# Patient Record
Sex: Female | Born: 1955
Health system: Southern US, Community
[De-identification: ages and names within clinical notes are randomized; demographics above are authoritative.]

## PROBLEM LIST (undated history)

## (undated) DIAGNOSIS — Q613 Polycystic kidney, unspecified: Secondary | ICD-10-CM

## (undated) DIAGNOSIS — M4800 Spinal stenosis, site unspecified: Secondary | ICD-10-CM

## (undated) DIAGNOSIS — M4722 Other spondylosis with radiculopathy, cervical region: Secondary | ICD-10-CM

## (undated) DIAGNOSIS — N200 Calculus of kidney: Secondary | ICD-10-CM

## (undated) DIAGNOSIS — M19049 Primary osteoarthritis, unspecified hand: Secondary | ICD-10-CM

## (undated) DIAGNOSIS — Z86010 Personal history of colonic polyps: Secondary | ICD-10-CM

## (undated) DIAGNOSIS — Z860101 Personal history of adenomatous and serrated colon polyps: Secondary | ICD-10-CM

## (undated) DIAGNOSIS — Q612 Polycystic kidney, adult type: Secondary | ICD-10-CM

## (undated) DIAGNOSIS — F411 Generalized anxiety disorder: Secondary | ICD-10-CM

## (undated) DIAGNOSIS — K7689 Other specified diseases of liver: Secondary | ICD-10-CM

## (undated) DIAGNOSIS — I10 Essential (primary) hypertension: Secondary | ICD-10-CM

## (undated) DIAGNOSIS — L290 Pruritus ani: Secondary | ICD-10-CM

## (undated) DIAGNOSIS — I493 Ventricular premature depolarization: Secondary | ICD-10-CM

## (undated) DIAGNOSIS — J309 Allergic rhinitis, unspecified: Secondary | ICD-10-CM

## (undated) DIAGNOSIS — J4541 Moderate persistent asthma with (acute) exacerbation: Secondary | ICD-10-CM

## (undated) HISTORY — DX: Calculus of kidney: N20.0

## (undated) HISTORY — DX: Moderate persistent asthma with (acute) exacerbation: J45.41

## (undated) HISTORY — DX: Other spondylosis with radiculopathy, cervical region: M47.22

## (undated) HISTORY — DX: Pruritus ani: L29.0

## (undated) HISTORY — DX: Spinal stenosis, site unspecified: M48.00

## (undated) HISTORY — DX: Primary osteoarthritis, unspecified hand: M19.049

## (undated) HISTORY — DX: Personal history of adenomatous and serrated colon polyps: Z86.0101

## (undated) HISTORY — DX: Other specified diseases of liver: K76.89

## (undated) HISTORY — DX: Generalized anxiety disorder: F41.1

## (undated) HISTORY — DX: Allergic rhinitis, unspecified: J30.9

## (undated) HISTORY — DX: Personal history of colonic polyps: Z86.010

## (undated) HISTORY — PX: AUGMENTATION MAMMAPLASTY: SUR837

## (undated) HISTORY — DX: Ventricular premature depolarization: I49.3

## (undated) HISTORY — DX: Polycystic kidney, adult type: Q61.2

## (undated) HISTORY — DX: Essential (primary) hypertension: I10

## (undated) HISTORY — DX: Polycystic kidney, unspecified: Q61.3

---

## 1990-03-02 HISTORY — PX: DILATION AND CURETTAGE OF UTERUS: SHX78

## 1991-03-03 HISTORY — PX: TUBAL LIGATION: SHX77

## 1997-03-02 HISTORY — PX: BREAST ENHANCEMENT SURGERY: SHX7

## 1997-07-30 ENCOUNTER — Ambulatory Visit (HOSPITAL_COMMUNITY): Admission: RE | Admit: 1997-07-30 | Discharge: 1997-07-30 | Payer: Self-pay | Admitting: Obstetrics and Gynecology

## 1997-09-11 ENCOUNTER — Other Ambulatory Visit: Admission: RE | Admit: 1997-09-11 | Discharge: 1997-09-11 | Payer: Self-pay | Admitting: Obstetrics and Gynecology

## 1998-04-08 ENCOUNTER — Other Ambulatory Visit: Admission: RE | Admit: 1998-04-08 | Discharge: 1998-04-08 | Payer: Self-pay | Admitting: Obstetrics and Gynecology

## 1998-07-29 ENCOUNTER — Encounter: Payer: Self-pay | Admitting: Obstetrics and Gynecology

## 1998-07-29 ENCOUNTER — Ambulatory Visit (HOSPITAL_COMMUNITY): Admission: RE | Admit: 1998-07-29 | Discharge: 1998-07-29 | Payer: Self-pay | Admitting: Obstetrics and Gynecology

## 1999-04-08 ENCOUNTER — Other Ambulatory Visit: Admission: RE | Admit: 1999-04-08 | Discharge: 1999-04-08 | Payer: Self-pay | Admitting: Obstetrics and Gynecology

## 1999-08-12 ENCOUNTER — Ambulatory Visit (HOSPITAL_COMMUNITY): Admission: RE | Admit: 1999-08-12 | Discharge: 1999-08-12 | Payer: Self-pay | Admitting: Internal Medicine

## 1999-08-12 ENCOUNTER — Encounter: Payer: Self-pay | Admitting: Internal Medicine

## 2000-04-07 ENCOUNTER — Other Ambulatory Visit: Admission: RE | Admit: 2000-04-07 | Discharge: 2000-04-07 | Payer: Self-pay | Admitting: Obstetrics and Gynecology

## 2000-08-17 ENCOUNTER — Ambulatory Visit (HOSPITAL_COMMUNITY): Admission: RE | Admit: 2000-08-17 | Discharge: 2000-08-17 | Payer: Self-pay | Admitting: Obstetrics and Gynecology

## 2000-08-17 ENCOUNTER — Encounter: Payer: Self-pay | Admitting: Obstetrics and Gynecology

## 2001-02-02 ENCOUNTER — Encounter: Admission: RE | Admit: 2001-02-02 | Discharge: 2001-02-02 | Payer: Self-pay | Admitting: Internal Medicine

## 2001-02-02 ENCOUNTER — Encounter: Payer: Self-pay | Admitting: Internal Medicine

## 2001-04-25 ENCOUNTER — Other Ambulatory Visit: Admission: RE | Admit: 2001-04-25 | Discharge: 2001-04-25 | Payer: Self-pay | Admitting: Obstetrics and Gynecology

## 2001-08-30 ENCOUNTER — Ambulatory Visit (HOSPITAL_COMMUNITY): Admission: RE | Admit: 2001-08-30 | Discharge: 2001-08-30 | Payer: Self-pay | Admitting: Obstetrics and Gynecology

## 2001-08-30 ENCOUNTER — Encounter: Payer: Self-pay | Admitting: Obstetrics and Gynecology

## 2002-05-01 ENCOUNTER — Other Ambulatory Visit: Admission: RE | Admit: 2002-05-01 | Discharge: 2002-05-01 | Payer: Self-pay | Admitting: Obstetrics and Gynecology

## 2002-09-12 ENCOUNTER — Encounter: Payer: Self-pay | Admitting: Obstetrics and Gynecology

## 2002-09-12 ENCOUNTER — Ambulatory Visit (HOSPITAL_COMMUNITY): Admission: RE | Admit: 2002-09-12 | Discharge: 2002-09-12 | Payer: Self-pay | Admitting: Obstetrics and Gynecology

## 2003-05-08 ENCOUNTER — Other Ambulatory Visit: Admission: RE | Admit: 2003-05-08 | Discharge: 2003-05-08 | Payer: Self-pay | Admitting: Obstetrics and Gynecology

## 2003-09-18 ENCOUNTER — Ambulatory Visit (HOSPITAL_COMMUNITY): Admission: RE | Admit: 2003-09-18 | Discharge: 2003-09-18 | Payer: Self-pay | Admitting: Obstetrics and Gynecology

## 2004-02-06 ENCOUNTER — Ambulatory Visit: Admission: RE | Admit: 2004-02-06 | Discharge: 2004-02-06 | Payer: Self-pay | Admitting: Internal Medicine

## 2004-02-06 ENCOUNTER — Encounter (INDEPENDENT_AMBULATORY_CARE_PROVIDER_SITE_OTHER): Payer: Self-pay | Admitting: *Deleted

## 2004-03-12 ENCOUNTER — Ambulatory Visit: Payer: Self-pay | Admitting: Cardiology

## 2004-03-19 ENCOUNTER — Ambulatory Visit: Payer: Self-pay | Admitting: Cardiology

## 2004-04-02 ENCOUNTER — Ambulatory Visit: Payer: Self-pay | Admitting: Internal Medicine

## 2004-04-28 ENCOUNTER — Ambulatory Visit: Payer: Self-pay | Admitting: Cardiology

## 2004-05-12 ENCOUNTER — Other Ambulatory Visit: Admission: RE | Admit: 2004-05-12 | Discharge: 2004-05-12 | Payer: Self-pay | Admitting: Obstetrics and Gynecology

## 2004-06-09 ENCOUNTER — Ambulatory Visit: Payer: Self-pay | Admitting: Internal Medicine

## 2004-09-22 ENCOUNTER — Ambulatory Visit (HOSPITAL_COMMUNITY): Admission: RE | Admit: 2004-09-22 | Discharge: 2004-09-22 | Payer: Self-pay | Admitting: Obstetrics and Gynecology

## 2004-09-22 ENCOUNTER — Ambulatory Visit (HOSPITAL_COMMUNITY): Admission: RE | Admit: 2004-09-22 | Discharge: 2004-09-22 | Payer: Self-pay | Admitting: Internal Medicine

## 2004-09-22 ENCOUNTER — Ambulatory Visit: Payer: Self-pay | Admitting: Internal Medicine

## 2004-10-06 ENCOUNTER — Ambulatory Visit: Payer: Self-pay | Admitting: Internal Medicine

## 2004-10-17 ENCOUNTER — Ambulatory Visit: Payer: Self-pay | Admitting: Internal Medicine

## 2004-12-08 ENCOUNTER — Ambulatory Visit: Payer: Self-pay | Admitting: Internal Medicine

## 2005-01-12 ENCOUNTER — Ambulatory Visit: Payer: Self-pay | Admitting: Internal Medicine

## 2005-02-27 ENCOUNTER — Ambulatory Visit: Payer: Self-pay | Admitting: Internal Medicine

## 2005-04-06 ENCOUNTER — Ambulatory Visit: Payer: Self-pay | Admitting: Internal Medicine

## 2005-07-29 ENCOUNTER — Other Ambulatory Visit: Admission: RE | Admit: 2005-07-29 | Discharge: 2005-07-29 | Payer: Self-pay | Admitting: Obstetrics and Gynecology

## 2005-09-24 ENCOUNTER — Ambulatory Visit (HOSPITAL_COMMUNITY): Admission: RE | Admit: 2005-09-24 | Discharge: 2005-09-24 | Payer: Self-pay | Admitting: Obstetrics and Gynecology

## 2005-10-06 ENCOUNTER — Ambulatory Visit: Payer: Self-pay | Admitting: Internal Medicine

## 2005-12-14 ENCOUNTER — Ambulatory Visit: Payer: Self-pay | Admitting: Internal Medicine

## 2005-12-28 ENCOUNTER — Ambulatory Visit: Payer: Self-pay | Admitting: Internal Medicine

## 2005-12-31 ENCOUNTER — Ambulatory Visit (HOSPITAL_COMMUNITY): Admission: RE | Admit: 2005-12-31 | Discharge: 2005-12-31 | Payer: Self-pay | Admitting: Internal Medicine

## 2006-01-01 ENCOUNTER — Ambulatory Visit: Payer: Self-pay | Admitting: Internal Medicine

## 2006-01-04 ENCOUNTER — Observation Stay (HOSPITAL_COMMUNITY): Admission: AD | Admit: 2006-01-04 | Discharge: 2006-01-05 | Payer: Self-pay | Admitting: Internal Medicine

## 2006-01-05 ENCOUNTER — Ambulatory Visit: Payer: Self-pay | Admitting: Internal Medicine

## 2006-01-11 ENCOUNTER — Ambulatory Visit (HOSPITAL_COMMUNITY): Admission: RE | Admit: 2006-01-11 | Discharge: 2006-01-11 | Payer: Self-pay | Admitting: Internal Medicine

## 2006-01-13 ENCOUNTER — Ambulatory Visit: Payer: Self-pay | Admitting: Internal Medicine

## 2006-02-01 ENCOUNTER — Ambulatory Visit (HOSPITAL_COMMUNITY): Admission: RE | Admit: 2006-02-01 | Discharge: 2006-02-01 | Payer: Self-pay | Admitting: Internal Medicine

## 2006-03-11 ENCOUNTER — Ambulatory Visit: Payer: Self-pay | Admitting: Internal Medicine

## 2006-03-24 ENCOUNTER — Ambulatory Visit: Payer: Self-pay | Admitting: Internal Medicine

## 2006-04-05 ENCOUNTER — Ambulatory Visit: Payer: Self-pay | Admitting: Internal Medicine

## 2006-04-05 LAB — CONVERTED CEMR LAB
Anti Nuclear Antibody(ANA): NEGATIVE
Rhuematoid fact SerPl-aCnc: <20 intl units/mL — ABNORMAL LOW (ref 0.0–20.0)
Rhuematoid fact SerPl-aCnc: <20 intl units/mL — ABNORMAL LOW (ref 0.0–20.0)
Sed Rate: 10 mm/hr (ref 0–25)
Sed Rate: 10 mm/hr (ref 0–25)
Vitamin B-12: 465 pg/mL (ref 211–911)

## 2006-04-13 ENCOUNTER — Ambulatory Visit: Payer: Self-pay | Admitting: Internal Medicine

## 2006-06-29 ENCOUNTER — Ambulatory Visit: Payer: Self-pay | Admitting: Internal Medicine

## 2006-06-29 LAB — CONVERTED CEMR LAB
BUN: 9 mg/dL (ref 6–23)
CO2: 30 meq/L (ref 19–32)
Calcium: 9.6 mg/dL (ref 8.4–10.5)
Chloride: 103 meq/L (ref 96–112)
Creatinine, Ser: 0.8 mg/dL (ref 0.4–1.2)
GFR calc Af Amer: 98 mL/min
GFR calc non Af Amer: 81 mL/min
Glucose, Bld: 97 mg/dL (ref 70–99)
Potassium: 4.4 meq/L (ref 3.5–5.1)
Sodium: 140 meq/L (ref 135–145)
TSH: 0.62 microintl units/mL (ref 0.35–5.50)
Vit D, 1,25-Dihydroxy: 64 — ABNORMAL HIGH (ref 20–57)
Vitamin B-12: 484 pg/mL (ref 211–911)

## 2006-08-26 ENCOUNTER — Other Ambulatory Visit: Admission: RE | Admit: 2006-08-26 | Discharge: 2006-08-26 | Payer: Self-pay | Admitting: Obstetrics and Gynecology

## 2006-09-22 ENCOUNTER — Encounter: Payer: Self-pay | Admitting: Internal Medicine

## 2006-09-22 DIAGNOSIS — G622 Polyneuropathy due to other toxic agents: Secondary | ICD-10-CM

## 2006-09-22 DIAGNOSIS — G6189 Other inflammatory polyneuropathies: Secondary | ICD-10-CM | POA: Insufficient documentation

## 2006-09-27 ENCOUNTER — Ambulatory Visit (HOSPITAL_COMMUNITY): Admission: RE | Admit: 2006-09-27 | Discharge: 2006-09-27 | Payer: Self-pay | Admitting: Obstetrics and Gynecology

## 2006-12-20 ENCOUNTER — Ambulatory Visit: Payer: Self-pay | Admitting: Internal Medicine

## 2006-12-27 DIAGNOSIS — J309 Allergic rhinitis, unspecified: Secondary | ICD-10-CM | POA: Insufficient documentation

## 2006-12-27 DIAGNOSIS — J45909 Unspecified asthma, uncomplicated: Secondary | ICD-10-CM | POA: Insufficient documentation

## 2006-12-27 DIAGNOSIS — I10 Essential (primary) hypertension: Secondary | ICD-10-CM | POA: Insufficient documentation

## 2007-06-16 ENCOUNTER — Ambulatory Visit: Payer: Self-pay | Admitting: Internal Medicine

## 2007-06-16 DIAGNOSIS — R519 Headache, unspecified: Secondary | ICD-10-CM | POA: Insufficient documentation

## 2007-06-16 DIAGNOSIS — R51 Headache: Secondary | ICD-10-CM | POA: Insufficient documentation

## 2007-06-16 DIAGNOSIS — J019 Acute sinusitis, unspecified: Secondary | ICD-10-CM | POA: Insufficient documentation

## 2007-06-17 LAB — CONVERTED CEMR LAB
BUN: 17 mg/dL (ref 6–23)
Basophils Absolute: 0 10*3/uL (ref 0.0–0.1)
Basophils Relative: 0.3 % (ref 0.0–1.0)
Bilirubin Urine: NEGATIVE
CO2: 29 meq/L (ref 19–32)
Calcium: 9.3 mg/dL (ref 8.4–10.5)
Chloride: 102 meq/L (ref 96–112)
Cholesterol: 169 mg/dL (ref 0–200)
Creatinine, Ser: 0.8 mg/dL (ref 0.4–1.2)
Eosinophils Absolute: 0.1 10*3/uL (ref 0.0–0.7)
Eosinophils Relative: 1.5 % (ref 0.0–5.0)
GFR calc Af Amer: 98 mL/min
GFR calc non Af Amer: 81 mL/min
Glucose, Bld: 81 mg/dL (ref 70–99)
HCT: 39 % (ref 36.0–46.0)
HDL: 63.1 mg/dL (ref >39.0)
Hemoglobin, Urine: NEGATIVE
Hemoglobin: 12.9 g/dL (ref 12.0–15.0)
Ketones, ur: NEGATIVE mg/dL
LDL Cholesterol: 84 mg/dL (ref 0–99)
Leukocytes, UA: NEGATIVE
Lymphocytes Relative: 23.5 % (ref 12.0–46.0)
MCHC: 33.1 g/dL (ref 30.0–36.0)
MCV: 85.7 fL (ref 78.0–100.0)
Monocytes Absolute: 0.4 10*3/uL (ref 0.1–1.0)
Monocytes Relative: 6.1 % (ref 3.0–12.0)
Neutro Abs: 4.3 10*3/uL (ref 1.4–7.7)
Neutrophils Relative %: 68.6 % (ref 43.0–77.0)
Nitrite: NEGATIVE
Platelets: 288 10*3/uL (ref 150–400)
Potassium: 4 meq/L (ref 3.5–5.1)
RBC: 4.56 M/uL (ref 3.87–5.11)
RDW: 14.1 % (ref 11.5–14.6)
Sodium: 139 meq/L (ref 135–145)
Specific Gravity, Urine: 1.015 (ref 1.000–1.03)
TSH: 0.74 microintl units/mL (ref 0.35–5.50)
Total CHOL/HDL Ratio: 2.7
Total Protein, Urine: NEGATIVE mg/dL
Triglycerides: 112 mg/dL (ref 0–149)
Urine Glucose: NEGATIVE mg/dL
Urobilinogen, UA: 0.2 (ref 0.0–1.0)
VLDL: 22 mg/dL (ref 0–40)
WBC: 6.3 10*3/uL (ref 4.5–10.5)
pH: 6.5 (ref 5.0–8.0)

## 2007-06-20 ENCOUNTER — Encounter: Payer: Self-pay | Admitting: Internal Medicine

## 2007-06-20 LAB — CONVERTED CEMR LAB: Vit D, 1,25-Dihydroxy: 35 (ref 30–89)

## 2007-09-05 ENCOUNTER — Other Ambulatory Visit: Admission: RE | Admit: 2007-09-05 | Discharge: 2007-09-05 | Payer: Self-pay | Admitting: Obstetrics and Gynecology

## 2007-09-30 ENCOUNTER — Ambulatory Visit: Payer: Self-pay | Admitting: Cardiology

## 2007-09-30 ENCOUNTER — Ambulatory Visit (HOSPITAL_COMMUNITY): Admission: RE | Admit: 2007-09-30 | Discharge: 2007-09-30 | Payer: Self-pay | Admitting: Obstetrics and Gynecology

## 2007-09-30 ENCOUNTER — Ambulatory Visit: Payer: Self-pay | Admitting: Internal Medicine

## 2007-09-30 LAB — CONVERTED CEMR LAB: TSH: 0.61 microintl units/mL (ref 0.35–5.50)

## 2007-10-24 ENCOUNTER — Telehealth: Payer: Self-pay | Admitting: Internal Medicine

## 2007-10-28 ENCOUNTER — Ambulatory Visit: Payer: Self-pay | Admitting: Internal Medicine

## 2007-10-28 LAB — HM COLONOSCOPY: HM Colonoscopy: NORMAL

## 2007-12-12 ENCOUNTER — Ambulatory Visit: Payer: Self-pay | Admitting: Cardiology

## 2007-12-12 ENCOUNTER — Ambulatory Visit: Payer: Self-pay | Admitting: Internal Medicine

## 2007-12-12 LAB — CONVERTED CEMR LAB
ALT: 22 units/L (ref 0–35)
AST: 25 units/L (ref 0–37)
Albumin: 3.9 g/dL (ref 3.5–5.2)
Alkaline Phosphatase: 66 units/L (ref 39–117)
BUN: 15 mg/dL (ref 6–23)
Basophils Absolute: 0 10*3/uL (ref 0.0–0.1)
Basophils Relative: 0.6 % (ref 0.0–3.0)
Bilirubin Urine: NEGATIVE
Bilirubin, Direct: 0.1 mg/dL (ref 0.0–0.3)
CO2: 31 meq/L (ref 19–32)
Calcium: 9.5 mg/dL (ref 8.4–10.5)
Chloride: 105 meq/L (ref 96–112)
Cholesterol: 163 mg/dL (ref 0–200)
Creatinine, Ser: 0.8 mg/dL (ref 0.4–1.2)
Eosinophils Absolute: 0.2 10*3/uL (ref 0.0–0.7)
Eosinophils Relative: 3.2 % (ref 0.0–5.0)
GFR calc Af Amer: 98 mL/min
GFR calc non Af Amer: 81 mL/min
Glucose, Bld: 95 mg/dL (ref 70–99)
HCT: 37.9 % (ref 36.0–46.0)
HDL: 52.8 mg/dL (ref >39.0)
Hemoglobin, Urine: NEGATIVE
Hemoglobin: 12.8 g/dL (ref 12.0–15.0)
Ketones, ur: NEGATIVE mg/dL
LDL Cholesterol: 78 mg/dL (ref 0–99)
Leukocytes, UA: NEGATIVE
Lymphocytes Relative: 29.9 % (ref 12.0–46.0)
MCHC: 33.8 g/dL (ref 30.0–36.0)
MCV: 84.8 fL (ref 78.0–100.0)
Monocytes Absolute: 0.6 10*3/uL (ref 0.1–1.0)
Monocytes Relative: 8.5 % (ref 3.0–12.0)
Neutro Abs: 3.9 10*3/uL (ref 1.4–7.7)
Neutrophils Relative %: 57.8 % (ref 43.0–77.0)
Nitrite: NEGATIVE
Platelets: 233 10*3/uL (ref 150–400)
Potassium: 4.2 meq/L (ref 3.5–5.1)
RBC: 4.47 M/uL (ref 3.87–5.11)
RDW: 14.2 % (ref 11.5–14.6)
Sodium: 143 meq/L (ref 135–145)
Specific Gravity, Urine: 1.015 (ref 1.000–1.03)
TSH: 1.19 microintl units/mL (ref 0.35–5.50)
Total Bilirubin: 0.6 mg/dL (ref 0.3–1.2)
Total CHOL/HDL Ratio: 3.1
Total Protein, Urine: NEGATIVE mg/dL
Total Protein: 6.5 g/dL (ref 6.0–8.3)
Triglycerides: 159 mg/dL — ABNORMAL HIGH (ref 0–149)
Urine Glucose: NEGATIVE mg/dL
Urobilinogen, UA: 0.2 (ref 0.0–1.0)
VLDL: 32 mg/dL (ref 0–40)
Vit D, 1,25-Dihydroxy: 42 (ref 30–89)
WBC: 6.7 10*3/uL (ref 4.5–10.5)
pH: 6 (ref 5.0–8.0)

## 2007-12-19 ENCOUNTER — Ambulatory Visit: Payer: Self-pay | Admitting: Internal Medicine

## 2007-12-19 DIAGNOSIS — M25539 Pain in unspecified wrist: Secondary | ICD-10-CM | POA: Insufficient documentation

## 2007-12-19 DIAGNOSIS — M25559 Pain in unspecified hip: Secondary | ICD-10-CM | POA: Insufficient documentation

## 2008-06-05 ENCOUNTER — Ambulatory Visit: Payer: Self-pay | Admitting: Internal Medicine

## 2008-06-06 LAB — CONVERTED CEMR LAB
BUN: 18 mg/dL (ref 6–23)
CO2: 32 meq/L (ref 19–32)
Calcium: 9.9 mg/dL (ref 8.4–10.5)
Chloride: 105 meq/L (ref 96–112)
Creatinine, Ser: 0.8 mg/dL (ref 0.4–1.2)
GFR calc non Af Amer: 80.42 mL/min (ref >60)
Glucose, Bld: 95 mg/dL (ref 70–99)
Potassium: 4.3 meq/L (ref 3.5–5.1)
Sodium: 142 meq/L (ref 135–145)

## 2008-06-18 ENCOUNTER — Ambulatory Visit: Payer: Self-pay | Admitting: Internal Medicine

## 2008-09-11 ENCOUNTER — Telehealth: Payer: Self-pay | Admitting: Internal Medicine

## 2008-10-01 ENCOUNTER — Encounter: Payer: Self-pay | Admitting: Internal Medicine

## 2008-10-02 ENCOUNTER — Ambulatory Visit (HOSPITAL_COMMUNITY): Admission: RE | Admit: 2008-10-02 | Discharge: 2008-10-02 | Payer: Self-pay | Admitting: Obstetrics and Gynecology

## 2008-11-12 ENCOUNTER — Encounter (INDEPENDENT_AMBULATORY_CARE_PROVIDER_SITE_OTHER): Payer: Self-pay | Admitting: *Deleted

## 2008-11-19 ENCOUNTER — Ambulatory Visit: Payer: Self-pay | Admitting: Cardiology

## 2008-11-19 DIAGNOSIS — R000 Tachycardia, unspecified: Secondary | ICD-10-CM | POA: Insufficient documentation

## 2008-11-19 DIAGNOSIS — R002 Palpitations: Secondary | ICD-10-CM | POA: Insufficient documentation

## 2008-11-20 ENCOUNTER — Telehealth: Payer: Self-pay | Admitting: Cardiology

## 2008-11-23 ENCOUNTER — Telehealth: Payer: Self-pay | Admitting: Cardiology

## 2008-11-29 ENCOUNTER — Encounter: Payer: Self-pay | Admitting: Cardiology

## 2008-11-29 ENCOUNTER — Ambulatory Visit: Payer: Self-pay | Admitting: Cardiology

## 2008-11-29 ENCOUNTER — Ambulatory Visit: Payer: Self-pay

## 2008-12-17 ENCOUNTER — Telehealth: Payer: Self-pay | Admitting: Cardiology

## 2008-12-19 ENCOUNTER — Telehealth: Payer: Self-pay | Admitting: Cardiology

## 2008-12-21 ENCOUNTER — Telehealth: Payer: Self-pay | Admitting: Internal Medicine

## 2009-10-08 ENCOUNTER — Ambulatory Visit (HOSPITAL_COMMUNITY): Admission: RE | Admit: 2009-10-08 | Discharge: 2009-10-08 | Payer: Self-pay | Admitting: Obstetrics and Gynecology

## 2010-03-30 LAB — CONVERTED CEMR LAB
BUN: 22 mg/dL (ref 6–23)
CO2: 32 meq/L (ref 19–32)
Calcium: 9.7 mg/dL (ref 8.4–10.5)
Chloride: 102 meq/L (ref 96–112)
Creatinine, Ser: 0.7 mg/dL (ref 0.4–1.2)
GFR calc non Af Amer: 93.64 mL/min (ref >60)
Glucose, Bld: 81 mg/dL (ref 70–99)
Potassium: 4 meq/L (ref 3.5–5.1)
Sodium: 142 meq/L (ref 135–145)

## 2010-04-01 NOTE — Assessment & Plan Note (Signed)
Summary: 6 MTH FU-$50-FLU SHOT -STC   Vital Signs:  Patient Profile:   55 Years Old Female Weight:      155 pounds Temp:     97.9 degrees F oral Pulse rate:   68 / minute BP sitting:   150 / 100  (left arm)  Vitals Entered By: Tora Perches (December 19, 2007 2:54 PM)                 Chief Complaint:  Multiple medical problems or concerns.  History of Present Illness: The patient presents for a follow up of neuropathy, asthma, allergies. C/o R LBP C/o R wrist pain C/o sinuss inf     Current Allergies (reviewed today): ! SULFA ! * AVAPRO ! ACE INHIBITORS ! CEFTIN AMLODIPINE BESYLATE (AMLODIPINE BESYLATE)  Past Medical History:    Reviewed history from 09/22/2006 and no changes required:       Acute neuropathy       Allergic rhinitis       Asthma       Hypertension       Elev ANA   Family History:    Reviewed history from 09/22/2006 and no changes required:       Family History of CAD Female 1st degree relative <60       Family History Hypertension  Social History:    Reviewed history from 09/22/2006 and no changes required:       Married       Never Smoked    Review of Systems  The patient denies vision loss, chest pain, headaches, abdominal pain, and severe indigestion/heartburn.       Impression & Recommendations:  Problem # 1:  HIP PAIN (ICD-719.45) R troch bursitis Assessment: New Inj if needed Her updated medication list for this problem includes:    Meloxicam 15 Mg Tabs (Meloxicam) ..... 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 1 spr each nostr once daily    Zithromax Z-pak 250 Mg Tabs (Azithromycin) ..... Use as directed   Problem # 4:  HYPERTENSION (ICD-401.9) Assessment:  Unchanged  The following medications were removed from the medication list:    Norvasc 5 Mg Tabs (Amlodipine besylate) .Marland Kitchen... Take one tab by mouth daily  Her updated medication list for this problem includes:    Toprol Xl 25 Mg Tb24 (Metoprolol succinate) .Marland Kitchen... 1 by mouth daily    Hydrochlorothiazide 25 Mg Tabs (Hydrochlorothiazide) .Marland Kitchen... Take 1/2  tab by mouth every morning   Problem # 5:  ASTHMA (ICD-493.90) Assessment: Unchanged  Her updated medication list for this problem includes:    Symbicort 160-4.5 Mcg/act Aero (Budesonide-formoterol fumarate) .Marland Kitchen..Marland Kitchen Two times a day   Complete Medication List: 1)  Zyrtec Allergy 10 Mg Tabs (Cetirizine hcl) .... Once daily 2)  Symbicort 160-4.5 Mcg/act Aero (Budesonide-formoterol fumarate) .... Two times a day 3)  Nasacort Aq 55 Mcg/act Aers (Triamcinolone acetonide(nasal)) .Marland Kitchen.. 1 spr each nostr once daily 4)  Vitamin D3 1000 Unit Tabs (Cholecalciferol) .Marland Kitchen.. 1 by mouth daily 5)  Zithromax Z-pak 250 Mg Tabs (Azithromycin) .... Use as directed 6)  Tamiflu 75 Mg Caps (Oseltamivir phosphate) .... Take one capsule by mouth twice a day 7)  Meloxicam 15 Mg Tabs (Meloxicam) .... One by mouth  daily pc 8)  Toprol Xl 25 Mg Tb24 (Metoprolol succinate) .Marland Kitchen.. 1 by mouth daily 9)  Hydrochlorothiazide 25 Mg Tabs (Hydrochlorothiazide) .... Take 1/2  tab by mouth every morning  Other Orders: Admin 1st Vaccine (16109) Flu Vaccine 66yrs + (60454)   Patient Instructions: 1)  Please schedule a follow-up appointment in 6 months. 2)  BMP prior to visit, ICD-9:995.20   Prescriptions: MELOXICAM 15 MG TABS (MELOXICAM) one by mouth daily pc  #30 x 6   Entered and Authorized by:   Tresa Garter MD   Signed by:   Tresa Garter MD on 12/19/2007   Method used:   Print then Give to Patient   RxID:   [id number] SYMBICORT 160-4.5 MCG/ACT  AERO (BUDESONIDE-FORMOTEROL FUMARATE) two times a day  #1 x 12   Entered and Authorized by:   Tresa Garter  MD   Signed by:   Tresa Garter MD on 12/19/2007   Method used:   Print then Give to Patient   RxID:   [id number] TAMIFLU 75 MG CAPS (OSELTAMIVIR PHOSPHATE) Take one capsule by mouth twice a day  #10 x 0   Entered and Authorized by:   Tresa Garter MD   Signed by:   Tresa Garter MD on 12/19/2007   Method used:   Print then Give to Patient   RxID:   [id number] ZITHROMAX Z-PAK 250 MG  TABS (AZITHROMYCIN) Use as directed  #1 x 0   Entered and Authorized by:   Tresa Garter MD   Signed by:   Tresa Garter MD on 12/19/2007   Method used:   Print then Give to Patient   RxID:   [id number]  ]  Influenza Vaccine (to be given today)         Flu Vaccine Consent Questions     Do you have a history of severe allergic reactions to this vaccine? no    Any prior history of allergic reactions to egg and/or gelatin? no    Do you have a sensitivity to the preservative Thimersol? no    Do you have a past history of Guillan-Barre Syndrome? no    Do you currently have an acute febrile illness? no    Have you ever had a severe reaction to latex? no    Vaccine information given and explained to patient? yes    Are you currently pregnant? no    Lot Number:[id number]   Site Given  Left Deltoid IMcflu

## 2010-04-01 NOTE — Miscellaneous (Signed)
  Clinical Lists Changes  Medications: Added new medication of MIRALAX   POWD (POLYETHYLENE GLYCOL 3350) As per prep  instructions. - Signed Added new medication of DULCOLAX 5 MG  TBEC (BISACODYL) Day before procedure take 2 at 3pm and 2 at 8pm. - Signed Added new medication of METOCLOPRAMIDE HCL 10 MG  TABS (METOCLOPRAMIDE HCL) As per prep instructions. - Signed Rx of MIRALAX   POWD (POLYETHYLENE GLYCOL 3350) As per prep  instructions.;  #255gm x 0;  Signed;  Entered by: Ezra Sites RN;  Authorized by: Iva Boop MD;  Method used: Print then Give to Patient Rx of DULCOLAX 5 MG  TBEC (BISACODYL) Day before procedure take 2 at 3pm and 2 at 8pm.;  #4 x 0;  Signed;  Entered by: Ezra Sites RN;  Authorized by: Iva Boop MD;  Method used: Print then Give to Patient Rx of METOCLOPRAMIDE HCL 10 MG  TABS (METOCLOPRAMIDE HCL) As per prep instructions.;  #2 x 0;  Signed;  Entered by: Ezra Sites RN;  Authorized by: Iva Boop MD;  Method used: Print then Give to Patient Allergies: Changed allergy or adverse reaction from SULFA to SULFA Changed allergy or adverse reaction from * AVAPRO to * AVAPRO Changed allergy or adverse reaction from ACE INHIBITORS to ACE INHIBITORS    Prescriptions: METOCLOPRAMIDE HCL 10 MG  TABS (METOCLOPRAMIDE HCL) As per prep instructions.  #2 x 0   Entered by:   Ezra Sites RN   Authorized by:   Iva Boop MD   Signed by:   Ezra Sites RN on 09/30/2007   Method used:   Print then Give to Patient   RxID:   [id number] DULCOLAX 5 MG  TBEC (BISACODYL) Day before procedure take 2 at 3pm and 2 at 8pm.  #4 x 0   Entered by:   Ezra Sites RN   Authorized by:   Iva Boop MD   Signed by:   Ezra Sites RN on 09/30/2007   Method used:   Print then Give to Patient   RxID:   [id number] MIRALAX   POWD (POLYETHYLENE GLYCOL 3350) As per prep  instructions.  #255gm x 0   Entered by:   Ezra Sites RN   Authorized by:   Iva Boop MD   Signed by:    Ezra Sites RN on 09/30/2007   Method used:   Print then Give to Patient   RxID:   [id number]

## 2010-04-01 NOTE — Progress Notes (Signed)
Summary: VACCINES  Phone Note Call from Patient   Caller: [phone] ext [phone] Summary of Call: Patient is requesting to know when her pneumovac & tetanus was. Chart ordered. Initial call taken by: Lamar Sprinkles,  September 11, 2008 11:23 AM  Follow-up for Phone Call        lm with pt's daughter for pt to call back, Last Tetanus was in 2004 and there is no record of Pneumovax Follow-up by: Ami Bullins CMA,  September 12, 2008 3:54 PM  Additional Follow-up for Phone Call Additional follow up Details #1::        Pt informed  Additional Follow-up by: Lamar Sprinkles,  September 13, 2008 5:21 PM

## 2010-04-01 NOTE — Assessment & Plan Note (Signed)
Summary: 6 MO ROV/ COMING FASTING/NWS   Vital Signs:  Patient Profile:   55 Years Old Female Weight:      150 pounds Temp:     98.1 degrees F oral Pulse rate:   64 / minute BP sitting:   130 / 75  (left arm) Cuff size:   regular  Pt. in pain?   no  Vitals Entered By: HT/SMA                  Chief Complaint:  6 mo rov.  History of Present Illness: The patient presents for a follow up of neuropathy, asthma, rhinitis. C/o sinus HA x 2 d     Current Allergies: ! SULFA ! * AVAPRO ! ACE INHIBITORS ! CEFTIN  Past Medical History:    Reviewed history from 09/22/2006 and no changes required:       Acute neuropathy       Allergic rhinitis       Asthma       Hypertension   Family History:    Reviewed history from 09/22/2006 and no changes required:       Family History of CAD Female 1st degree relative <60       Family History Hypertension  Social History:    Reviewed history from 09/22/2006 and no changes required:       Married       Never Smoked    Review of Systems  The patient denies anorexia, fever, chest pain, syncope, hemoptysis, abdominal pain, hematochezia, severe indigestion/heartburn, depression, and unusual weight change.         No paresthesia   Physical Exam  General:     Well-developed,well-nourished,in no acute distress; alert,appropriate and cooperative throughout examination Eyes:     No corneal or conjunctival inflammation noted. EOMI. Perrla. Funduscopic exam benign, without hemorrhages, exudates or papilledema. Vision grossly normal. Nose:     External nasal examination shows no deformity or inflammation. Nasal mucosa are pink and moist without lesions or exudates. Mouth:     Eryth mucosa Lungs:     Normal respiratory effort, chest expands symmetrically. Lungs are clear to auscultation, no crackles or wheezes. Heart:     Normal rate and regular rhythm. S1 and S2 normal without gallop, murmur, click, rub or other extra  sounds. Abdomen:     Bowel sounds positive,abdomen soft and non-tender without masses, organomegaly or hernias noted. Msk:     No deformity or scoliosis noted of thoracic or lumbar spine.   Skin:     Intact without suspicious lesions or rashes    Impression & Recommendations:  Problem # 1:  NEUROPATHY, OTHER INFLAMMATORY AND TOXIC (ICD-357.89) Assessment: Comment Only Resolved. F/u with a rheumatol. for elev ANA  Problem # 2:  SINUSITIS, ACUTE (ICD-461.9) Assessment: New  Her updated medication list for this problem includes:    Nasacort Aq 55 Mcg/act Aers (Triamcinolone acetonide(nasal)) .Marland Kitchen... 1 spr each nostr once daily    Zithromax Z-pak 250 Mg Tabs (Azithromycin) ..... Use as directed   Problem # 3:  HYPERTENSION (ICD-401.9)  Her updated medication list for this problem includes:    Norvasc 5 Mg Tabs (Amlodipine besylate) .Marland Kitchen... Take one tab by mouth daily   Problem # 4:  ASTHMA (ICD-493.90)  The following medications were removed from the medication list:    Singulair 10 Mg Tabs (Montelukast sodium) .Marland Kitchen... Take one tab by mouth daily  Her updated medication list for this problem includes:    Symbicort 160-4.5  Mcg/act Aero (Budesonide-formoterol fumarate) .Marland Kitchen..Marland Kitchen Two times a day   Problem # 5:  ALLERGIC RHINITIS (ICD-477.9)  Her updated medication list for this problem includes:    Zyrtec Allergy 10 Mg Tabs (Cetirizine hcl) ..... Once daily    Nasacort Aq 55 Mcg/act Aers (Triamcinolone acetonide(nasal)) .Marland Kitchen... 1 spr each nostr once daily   Problem # 6:  HEADACHE (ICD-784.0) Due to #2  Complete Medication List: 1)  Premarin 0.9 Mg Tabs (Estrogens conjugated) .... Take one tab by mouth daily 2)  Norvasc 5 Mg Tabs (Amlodipine besylate) .... Take one tab by mouth daily 3)  Provera 10 Mg Tabs (Medroxyprogesterone acetate) 4)  Zyrtec Allergy 10 Mg Tabs (Cetirizine hcl) .... Once daily 5)  Symbicort 160-4.5 Mcg/act Aero (Budesonide-formoterol fumarate) .... Two times a  day 6)  Nasacort Aq 55 Mcg/act Aers (Triamcinolone acetonide(nasal)) .Marland Kitchen.. 1 spr each nostr once daily 7)  Vitamin D3 1000 Unit Tabs (Cholecalciferol) .Marland Kitchen.. 1 by mouth daily 8)  Zithromax Z-pak 250 Mg Tabs (Azithromycin) .... Use as directed   Patient Instructions: 1)  Please schedule a follow-up appointment in 6 months. 2)  BMP prior to visit, ICD-9: 3)  Hepatic Panel prior to visit, ICD-9: 4)  Lipid Panel prior to visit, ICD-9: 5)  TSH prior to visit, ICD-9:401.1 995.20 6)  CBC w/ Diff prior to visit, ICD-9: 7)  Urine-dip prior to visit, ICD-9: 8)  Vit D     Prescriptions: NASACORT AQ 55 MCG/ACT  AERS (TRIAMCINOLONE ACETONIDE(NASAL)) 1 spr each nostr once daily  #3 x 3   Entered and Authorized by:   Tresa Garter MD   Signed by:   Tresa Garter MD on 06/16/2007   Method used:   Print then Give to Patient   RxID:   [id number] NASACORT AQ 55 MCG/ACT  AERS (TRIAMCINOLONE ACETONIDE(NASAL)) 1 spr each nostr once daily  #1 x 12   Entered and Authorized by:   Tresa Garter MD   Signed by:   Tresa Garter MD on 06/16/2007   Method used:   Print then Give to Patient   RxID:   [id number] ZITHROMAX Z-PAK 250 MG  TABS (AZITHROMYCIN) Use as directed  #1 x 0   Entered and Authorized by:   Tresa Garter MD   Signed by:   Tresa Garter MD on 06/16/2007   Method used:   Print then Give to Patient   RxID:   [id number]  ]

## 2010-04-01 NOTE — Assessment & Plan Note (Signed)
Summary: 6 MO ROV /NWS  $50   Vital Signs:  Patient profile:   55 year old female Height:      61 inches Weight:      153 pounds BMI:     29.01 Temp:     97.7 degrees F oral Pulse rate:   72 / minute BP sitting:   130 / 86  (left arm)  Vitals Entered By: Tora Perches (June 18, 2008 2:18 PM) CC: f/u Is Patient Diabetic? No   CC:  f/u.  History of Present Illness: The patient presents for a follow up of hypertension, neuropathy, asthma. C/o stress - daughter had a MVA.  Current Medications (verified): 1)  Zyrtec Allergy 10 Mg  Tabs (Cetirizine Hcl) .... Once Daily 2)  Symbicort 160-4.5 Mcg/act  Aero (Budesonide-Formoterol Fumarate) .... Two Times A Day 3)  Nasacort Aq 55 Mcg/act  Aers (Triamcinolone Acetonide(Nasal)) .Marland Kitchen.. 1 Spr Each Nostr Once Daily 4)  Vitamin D3 1000 Unit  Tabs (Cholecalciferol) .Marland Kitchen.. 1 By Mouth Daily 5)  Meloxicam 15 Mg Tabs (Meloxicam) .... One By Mouth Daily Pc 6)  Toprol Xl 25 Mg Tb24 (Metoprolol Succinate) .Marland Kitchen.. 1 By Mouth Daily 7)  Hydrochlorothiazide 25 Mg  Tabs (Hydrochlorothiazide) .... Take 1/2  Tab By Mouth Every Morning  Allergies: 1)  ! Sulfa 2)  ! * Avapro 3)  ! Ace Inhibitors 4)  ! Ceftin 5)  ! Prinivil (Lisinopril) 6)  Amlodipine Besylate (Amlodipine Besylate)  Past History:  Past Medical History:    1. Hypertension.    2. Palpitations with an event monitor in April 2006 showing frequent        PVCs and a Holter monitor in August 2009 showing PVCs.  She does        have significant symptoms and so she has been started on Toprol-XL.    3. Asthma.    4. Sensory neuropathy.  This was thought to be possibly post viral.        She has had an extensive workup that has been unrevealing so far.        She has followup at [facility] in the Neurology Department there.     5. Echocardiogram was done in December 2005 showed an EF of 55-65%.  No    regional wall motion abnormalities.  No pulmonary hypertension.  No    significant valvular  abnormalities.     6. Allergic rhinitis    7. Elevated ANA     (02/12/2008)  Family History:    Family History of CAD Female 1st degree relative <60.    Family History Hypertension.     (02/12/2008)  Social History:    [place]  She does not smoke.  She does not use alcohol.  She does not    use illicit drugs.  She does not take over-the-counter cold medicines    and she is trying to cut back on caffeine.  (02/12/2008)  Risk Factors:    Alcohol Use: N/A    >5 drinks/d w/in last 3 months: N/A    Caffeine Use: N/A    Diet: N/A    Exercise: N/A  Risk Factors:    Smoking Status: never (09/22/2006)    Packs/Day: N/A    Cigars/wk: N/A    Pipe Use/wk: N/A    Cans of tobacco/wk: N/A    Passive Smoke Exposure: N/A  Family History:    Reviewed history from 02/12/2008  and no changes required:       Family History of CAD Female 1st degree relative <60.       Family History Hypertension.  Social History:    Reviewed history from 02/12/2008 and no changes required:       The patient lives with her husband in [place], [place].  She does not smoke.  She does not use alcohol.  She does not       use illicit drugs.  She does not take over-the-counter cold medicines       and she is trying to cut back on caffeine.   Review of Systems  The patient denies fever, syncope, and abdominal pain.    Physical Exam  General:  Well-developed,well-nourished,in no acute distress; alert,appropriate and cooperative throughout examination Ears:  External ear exam shows no significant lesions or deformities.  Otoscopic examination reveals clear canals, tympanic membranes are intact bilaterally without bulging, retraction, inflammation or discharge. Hearing is grossly normal bilaterally. Nose:  External nasal examination shows no deformity or inflammation. Nasal mucosa are pink and moist without lesions or exudates. Mouth:  Eryth  mucosa Neck:  No deformities, masses, or tenderness noted. Lungs:  Normal respiratory effort, chest expands symmetrically. Lungs are clear to auscultation, no crackles or wheezes. Heart:  Normal rate and regular rhythm. S1 and S2 normal without gallop, murmur, click, rub or other extra sounds. Abdomen:  Bowel sounds positive,abdomen soft and non-tender without masses, organomegaly or hernias noted. Msk:  No deformity or scoliosis noted of thoracic or lumbar spine.   Skin:  Intact without suspicious lesions or rashes Psych:  Cognition and judgment appear intact. Alert and cooperative with normal attention span and concentration. No apparent delusions, illusions, hallucinations   Impression & Recommendations:  Problem # 1:  HYPERTENSION (ICD-401.9) Assessment Improved  Her updated medication list for this problem includes:    Toprol Xl 25 Mg Tb24 (Metoprolol succinate) .Marland Kitchen... 1 by mouth daily    Hydrochlorothiazide 25 Mg Tabs (Hydrochlorothiazide) .Marland Kitchen... Take 1/2  tab by mouth every morning  BP today: 130/86 Prior BP: 150/100 (12/19/2007)  Labs Reviewed: K+: 4.3 (06/05/2008) Creat: : 0.8 (06/05/2008)   Chol: 163 (12/12/2007)   HDL: 52.8 (12/12/2007)   LDL: 78 (12/12/2007)   TG: 159 (12/12/2007)  Problem # 2:  NEUROPATHY, OTHER INFLAMMATORY AND TOXIC (ICD-357.89) Assessment: Comment Only No symptoms  Problem # 3:  ASTHMA (ICD-493.90) Assessment: Unchanged  Her updated medication list for this problem includes:    Symbicort 160-4.5 Mcg/act Aero (Budesonide-formoterol fumarate) .Marland Kitchen..Marland Kitchen Two times a day  Problem # 4:  ALLERGIC RHINITIS (ICD-477.9) Assessment: Deteriorated  Her updated medication list for this problem includes:    Zyrtec Allergy 10 Mg Tabs (Cetirizine hcl) ..... Once daily    Nasacort Aq 55 Mcg/act Aers (Triamcinolone acetonide(nasal)) .Marland Kitchen... 1 spr each nostr once daily  Complete Medication List: 1)  Zyrtec Allergy 10 Mg Tabs (Cetirizine hcl) .... Once daily 2)   Symbicort 160-4.5 Mcg/act Aero (Budesonide-formoterol fumarate) .... Two times a day 3)  Nasacort Aq 55 Mcg/act Aers (Triamcinolone acetonide(nasal)) .Marland Kitchen.. 1 spr each nostr once daily 4)  Vitamin D3 1000 Unit Tabs (Cholecalciferol) .Marland Kitchen.. 1 by mouth daily 5)  Meloxicam 15 Mg Tabs (Meloxicam) .... One by mouth daily pc 6)  Toprol Xl 25 Mg Tb24 (Metoprolol succinate) .Marland Kitchen.. 1 by mouth daily 7)  Hydrochlorothiazide 25 Mg Tabs (Hydrochlorothiazide) .... Take 1/2  tab by mouth every morning 8)  Lorazepam 1 Mg Tabs (  Lorazepam) .Marland Kitchen.. 1 by mouth two times a day as needed anxiety  Patient Instructions: 1)  Please schedule a follow-up appointment in 6 months. 2)  BMP prior to visit, ICD-9: 3)  Hepatic Panel prior to visit, ICD-9:401.1 4)  Lipid Panel prior to visit, ICD-9: 5)  TSH prior to visit, ICD-9: Prescriptions: NASACORT AQ 55 MCG/ACT  AERS (TRIAMCINOLONE ACETONIDE(NASAL)) 1 spr each nostr once daily  #1 x 12   Entered and Authorized by:   Tresa Garter MD   Signed by:   Tresa Garter MD on 06/18/2008   Method used:   Print then Give to Patient   RxID:   [id number] SYMBICORT 160-4.5 MCG/ACT  AERO (BUDESONIDE-FORMOTEROL FUMARATE) two times a day  #1 x 12   Entered and Authorized by:   Tresa Garter MD   Signed by:   Tresa Garter MD on 06/18/2008   Method used:   Print then Give to Patient   RxID:   [id number] LORAZEPAM 1 MG  TABS (LORAZEPAM) 1 by mouth two times a day as needed anxiety  #180 x 1   Entered and Authorized by:   Tresa Garter MD   Signed by:   Tresa Garter MD on 06/18/2008   Method used:   Print then Give to Patient   RxID:   [id number]

## 2010-04-01 NOTE — Progress Notes (Signed)
Summary: give update on med   Phone Note Call from Patient Call back at Home Phone [phone]   Caller: Patient Call For: gessner Reason for Call: Talk to Nurse Summary of Call: wanted to let us know that her pcp changed her bp med to toporol xl having proc is on friday  Initial call taken by: Tawni Levy,  October 24, 2007 1:38 PM  Follow-up for Phone Call        Returned pt's phone call.  Instructed pt. to inform admitting nurse of the cange in medication so that it could be written on her chart. Follow-up by: Ezra Sites RN,  October 24, 2007 3:04 PM

## 2010-04-01 NOTE — Consult Note (Signed)
Summary: [name]  [facility]   Imported By: Maryln Gottron 06/29/2007 13:10:43  _____________________________________________________________________  External Attachment:    Type:   Image     Comment:   External Document

## 2010-04-01 NOTE — Procedures (Signed)
Summary: Colonoscopy   Colonoscopy  Procedure date:  10/28/2007  Findings:      Location:  [facility].    Procedures Next Due Date:    Colonoscopy: 09/2017  Patient Name: [name], [name] MRN: [id number] Procedure Procedures: Colonoscopy CPT: 40981.  Personnel: Endoscopist: Iva Boop, MD, [facility].  Referred By: Meredeth Ide, MD.  Exam Location: Exam performed in Outpatient Clinic. Outpatient  Patient Consent: Procedure, Alternatives, Risks and Benefits discussed, consent obtained, from patient. Consent was obtained by the RN.  Indications  Average Risk Screening Routine.  History  Current Medications: Patient is not currently taking Coumadin.  Pre-Exam Physical: Performed Oct 28, 2007. Cardio-pulmonary exam, Rectal exam, HEENT exam , Abdominal exam, Mental status exam WNL.  Comments: Pt. history reviewed/updated, physical exam performed prior to initiation of sedation? yes Exam Exam: Extent of exam reached: Cecum, extent intended: Cecum.  The cecum was identified by appendiceal orifice and IC valve. Patient position: on left side. Time to Cecum: 00:04:44. Time for Withdrawl: 00:13:20. Colon retroflexion performed. Images taken. ASA Classification: II. Tolerance: excellent.  Monitoring: Pulse and BP monitoring, Oximetry used. Supplemental O2 given.  Colon Prep Used MiraLax for colon prep. Prep results: excellent.  Sedation Meds: Patient assessed and found to be appropriate for moderate (conscious) sedation. Fentanyl 50 mcg. given IV. Versed 6 mg. given IV.  Findings - NORMAL EXAM: Cecum to Rectum.   Assessment  Comments: 1) NORMAL SCREENING COLONOSCOPY 2) EXCELLENT PREP Events  Unplanned Interventions: No intervention was required.  Plans Patient Education: Patient given standard instructions for: a normal exam.  Disposition: After procedure patient sent to recovery. After recovery patient sent home.   Scheduling/Referral: Colonoscopy, ROUTINE IN 10 YRS,   Comments: WOULD NEED COLONOSCOPY SOONER TO INVESTIGATE PROBLEMS AS APPROPRIATE OR IF FAMILY HISTORY ISSUES ARISE    cc.   Aram Beecham Romine,MD

## 2010-04-01 NOTE — Letter (Signed)
Summary: Appointment - Reminder 2  [employer], Main Office  [address]   [place], [place] [address]   Phone: [phone]  Fax: [phone]     November 12, 2008 MRN: [id number]   [name] [name] [address] [place], [place]  [address]   Dear [name],  Our records indicate that it is time to schedule a follow-up appointment with Dr. Shirlee Latch. It is very important that we reach you to schedule this appointment. We look forward to participating in your health care needs. Please contact us at the number listed above at your earliest convenience to schedule your appointment.  If you are unable to make an appointment at this time, give [place] a call so we can update our records.  Sincerely,   Migdalia Dk [facility] Scheduling Team

## 2010-04-01 NOTE — Progress Notes (Signed)
Summary: Patient's at Home Vitals  Patient's at Home Vitals   Imported By: Marylou Mccoy 12/03/2008 08:49:58  _____________________________________________________________________  External Attachment:    Type:   Image     Comment:   External Document  Appended Document: Patient's at Home Vitals BP looks ok.   Appended Document: Patient's at Home Vitals talked with patient

## 2010-04-01 NOTE — Progress Notes (Signed)
Summary: PT NOT FEELING WELL WANT TO TALK TO NURSE.   Phone Note Call from Patient Call back at Work Phone [phone]   Summary of Call: PT SAID SHE IS NOT FEELING WELL WANT TO TALK TO NURSE,WOULD NOT GIVE ANY INFORMATION. Initial call taken by: Judie Grieve,  December 17, 2008 1:17 PM  Follow-up for Phone Call        [facility] Katina Dung, RN, BSN  December 17, 2008 1:46 PM  talked with patient--pt took Toprol from 10/05-10/14/10 and she stopped because her heart was skipping so bad and she felt better off Toprol-- she stopped Toprol 12/14/08 and began to feel some better yesterday--she states her B/P and heart rate have been OK -similar to readings she sent in in [place] continues to feel pounding in her chest and some skips,she is asking if there is another treatment option to help manage her symptoms--I will forward to Dr Shirlee Latch for review      Appended Document: PT NOT FEELING WELL WANT TO TALK TO NURSE. talked with pt --she will try Diltiazem CD120mg  daily as recommended by Dr Shirlee Latch 12/17/08--call me in 10-14  days if symptoms do not improve or sooner if they worsen   Clinical Lists Changes  Medications: Added new medication of DILTIAZEM HCL CR 120 MG XR12H-CAP (DILTIAZEM HCL) 1 daily - Signed Rx of DILTIAZEM HCL CR 120 MG XR12H-CAP (DILTIAZEM HCL) 1 daily;  #30 x 11;  Signed;  Entered by: Katina Dung, RN, BSN;  Authorized by: Marca Ancona, MD;  Method used: [facility] [phone]*, [address], [place], [place], [place]  [address], Ph: [phone] or [phone], Fax: [phone] Observations: Added new observation of MEDRECON: current updated (12/19/2008 15:35)    Prescriptions: DILTIAZEM HCL CR 120 MG XR12H-CAP (DILTIAZEM HCL) 1 daily  #30 x 11   Entered by:   Katina Dung, RN, BSN   Authorized by:   Marca Ancona, MD   Signed by:   Katina Dung, RN, BSN on 12/19/2008   Method used:   Electronically to        [facility] [phone]* (retail)       [address]       [place], [place]  [address]       Ph: [phone] or [phone]       Fax: [phone]   RxID:   [id number]     Current Medications (verified): 1)  Zyrtec Allergy 10 Mg  Tabs (Cetirizine Hcl) .... Once Daily 2)  Symbicort 160-4.5 Mcg/act  Aero (Budesonide-Formoterol Fumarate) .... Two Times A Day 3)  Nasacort Aq 55 Mcg/act  Aers (Triamcinolone Acetonide(Nasal)) .Marland Kitchen.. 1 Spr Each Nostr Once Daily 4)  Meloxicam 15 Mg Tabs (Meloxicam) .... One By Mouth Daily Pc 5)  Hydrochlorothiazide 25 Mg  Tabs (Hydrochlorothiazide) .... Take 1/2  Tab By Mouth Every Morning 6)  Lorazepam 1 Mg  Tabs (Lorazepam) .Marland Kitchen.. 1 By Mouth Two Times A Day As Needed Anxiety 7)  Multivitamins   Tabs (Multiple Vitamin) .Marland Kitchen.. 1 Tab By Mouth Once Daily 8)  Calcium 600 600 Mg Tabs (Calcium Carbonate) .Marland Kitchen.. 1 Tab By Mouth Once Daily 9)  Diltiazem Hcl Cr 120 Mg Xr12h-Cap (Diltiazem Hcl) .Marland Kitchen.. 1 Daily  Allergies: 1)  ! Sulfa 2)  ! * Avapro 3)  ! Ace Inhibitors 4)  ! Ceftin 5)  ! Prinivil (Lisinopril) 6)  Amlodipine Besylate (Amlodipine Besylate)

## 2010-04-01 NOTE — Letter (Signed)
Summary: [facility]   Imported By: Sherian Rein 10/16/2008 11:52:53  _____________________________________________________________________  External Attachment:    Type:   Image     Comment:   External Document

## 2010-04-01 NOTE — Assessment & Plan Note (Signed)
Summary: ROV/ GD  Medications Added MULTIVITAMINS   TABS (MULTIPLE VITAMIN) 1 tab by mouth once daily CALCIUM 600 600 MG TABS (CALCIUM CARBONATE) 1 tab by mouth once daily        Primary Provider:  Tresa Garter MD  CC:  palpitations.  History of Present Illness: 55 yo with h/o palpitations associated with PVCs on event monitor and HTN presents for evaluation of heart fluttering/tachycardia.  Patient had done well for a number of months after starting Toprol XL.  However, for the last 2 wks, she has had episodes where her heart will beat hard and fast.  This will last for a few minutes at a time and resolve.  She has been taking Ativan daily due to the anxiety that it is causing her.  She has not chest pain or exertional shortness of breath.  She has not become lightheaded or passed out.  BP is borderline today but SBP has been 110s-120s at home.    ECG: NSR, borderline left axis deviation  Labs (9/10): K 4.0, creatinine 0.7  Current Medications (verified): 1)  Zyrtec Allergy 10 Mg  Tabs (Cetirizine Hcl) .... Once Daily 2)  Symbicort 160-4.5 Mcg/act  Aero (Budesonide-Formoterol Fumarate) .... Two Times A Day 3)  Nasacort Aq 55 Mcg/act  Aers (Triamcinolone Acetonide(Nasal)) .Marland Kitchen.. 1 Spr Each Nostr Once Daily 4)  Meloxicam 15 Mg Tabs (Meloxicam) .... One By Mouth Daily Pc 5)  Toprol Xl 25 Mg Tb24 (Metoprolol Succinate) .Marland Kitchen.. 1 By Mouth Daily 6)  Hydrochlorothiazide 25 Mg  Tabs (Hydrochlorothiazide) .... Take 1/2  Tab By Mouth Every Morning 7)  Lorazepam 1 Mg  Tabs (Lorazepam) .Marland Kitchen.. 1 By Mouth Two Times A Day As Needed Anxiety 8)  Multivitamins   Tabs (Multiple Vitamin) .Marland Kitchen.. 1 Tab By Mouth Once Daily 9)  Calcium 600 600 Mg Tabs (Calcium Carbonate) .Marland Kitchen.. 1 Tab By Mouth Once Daily  Allergies: 1)  ! Sulfa 2)  ! * Avapro 3)  ! Ace Inhibitors 4)  ! Ceftin 5)  ! Prinivil (Lisinopril) 6)  Amlodipine Besylate (Amlodipine Besylate)  Past History:  Past Medical History: 1.  Hypertension. 2. Palpitations with an event monitor in April 2006 showing frequent     PVCs and a Holter monitor in August 2009 showing PVCs.  She does     have significant symptoms and so she has been started on Toprol-XL. 3. Asthma. 4. Sensory neuropathy.  This was thought to be possibly post viral.     She has had an extensive workup that has been unrevealing so far.     She has followup at [facility] in the Neurology Department there.  5. Echocardiogram was done in December 2005 showed an EF of 55-65%.  No regional wall motion abnormalities.  No pulmonary hypertension.  No significant valvular abnormalities.  6. Allergic rhinitis 7. Elevated ANA  Family History: Reviewed history from 02/12/2008 and no changes required. Family History of CAD Female 1st degree relative <60. Family History Hypertension.  Social History: The patient lives with her husband in [place].  She does not smoke.  She does not use alcohol.  She does not use illicit drugs.  She does not take over-the-counter cold medicines and she is trying to cut back on caffeine.   Review of Systems       All systems reviewed and negative except as per HPI.   Vital Signs:  Patient profile:   55 year old female Height:      61 inches Weight:  156 pounds BMI:     29.58 Pulse rate:   58 / minute Resp:     12 per minute BP sitting:   140 / 79  (left arm)  Vitals Entered By: Kem Parkinson (November 19, 2008 8:14 AM)  Physical Exam  General:  Well developed, well nourished, in no acute distress. Neck:  Neck supple, no JVD. No masses, thyromegaly or abnormal cervical nodes. Lungs:  Clear bilaterally to auscultation and percussion. Heart:  Non-displaced PMI, chest non-tender; regular rate and rhythm, S1, S2 without murmurs, rubs or gallops. Carotid upstroke normal, no bruit.  Pedals normal pulses. No edema, no varicosities. Abdomen:  Bowel sounds positive; abdomen soft and non-tender without masses, organomegaly,  or hernias noted. No hepatosplenomegaly. Extremities:  No clubbing or cyanosis. Neurologic:  Alert and oriented x 3. Psych:  Normal affect.   Impression & Recommendations:  Problem # 1:  PALPITATIONS (ICD-785.1) Patient has had new onset tachypalpitations for the last 2 weeks.  Her palpitations had been suppressed by Toprol XL prior to this.  She has not had syncope or presyncope.  She is having the symptoms daily.  She is not hypokalemic (takes HCTZ).  I will have her do a 48 hour holter monitor to make sure that she does not have any more significant arrhythmias than her prior document PVCs. We will also do an echocardiogram to make sure that her heart is structurally normal.   Problem # 2:  HYPERTENSION (ICD-401.9) BP is borderline today but has been good at home.  Continue current meds.   Other Orders: EKG w/ Interpretation (93000) TLB-BMP (Basic Metabolic Panel-BMET) (80048-METABOL) Echocardiogram (Echo) Holter (Holter)  Patient Instructions: 1)  Your physician has requested that you have an echocardiogram.  Echocardiography is a painless test that uses sound waves to create images of your heart. It provides your doctor with information about the size and shape of your heart and how well your heart's chambers and valves are working.  This procedure takes approximately one hour. There are no restrictions for this procedure. 2)  Your physician has recommended that you wear a holter monitor.  Holter monitors are medical devices that record the heart's electrical activity. Doctors most often use these monitors to diagnose arrhythmias. Arrhythmias are problems with the speed or rhythm of the heartbeat. The monitor is a small, portable device. You can wear one while you do your normal daily activities. This is usually used to diagnose what is causing palpitations/syncope (passing out). 3)  Your physician recommends that you have lab work today BMP/785.1 401.9 4)  Your physician recommends that  you schedule a follow-up appointment in: as needed 5)  Your physician recommends that you continue on your current medications as directed. Please refer to the Current Medication list given to you today.

## 2010-04-01 NOTE — Procedures (Signed)
Summary: summary report  summary report   Imported By: Mirna Mires 12/05/2008 09:42:51  _____________________________________________________________________  External Attachment:    Type:   Image     Comment:   External Document

## 2010-04-01 NOTE — Progress Notes (Signed)
Summary: STILL HAVING PALPS/VERY AGITATED   Phone Note Call from Patient Call back at Home Phone [phone] Call back at Work Phone [phone] Call back at AFTER 220PM   Caller: Patient Reason for Call: Talk to Nurse Summary of Call: PATIENT IS VERY AGITATED AND STATES SHE FEELS OVERWHELMED BY THE PALPS SHE IS HAVING.  SCHEDULED FOR ECHO AND HOLTER 9-30.  PLEASE CALL TO DISCUSS SYMPTOMS. Initial call taken by: Burnard Leigh,  November 20, 2008 1:30 PM  Follow-up for Phone Call        talked with patient --requesting earlier appt for echo--we can do the echo earlier but not the holter-pt decided to leave the appts as they are now     Appended Document: STILL HAVING PALPS/VERY AGITATED she can try diltiazem CD 120 mg daily  Appended Document: STILL HAVING PALPS/VERY AGITATED LMVM

## 2010-04-01 NOTE — Progress Notes (Signed)
Summary: lorazepam  Phone Note Other Incoming Call back at fax   [phone] Summary of Call: refill request on lorazepam---ok x 1 refill--will fax to pharmacy/vg Initial call taken by: Tora Perches,  December 21, 2008 10:39 AM    Prescriptions: LORAZEPAM 1 MG  TABS (LORAZEPAM) 1 by mouth two times a day as needed anxiety  #180 x 1   Entered by:   Tora Perches   Authorized by:   Tresa Garter MD   Signed by:   Tora Perches on 12/21/2008   Method used:   Telephoned to ...       [facility] [phone][facility] (retail)       [address]       [place], [place]  [address]       Ph: [phone] or [phone]       Fax: [phone]   RxID:   [id number]

## 2010-04-01 NOTE — Progress Notes (Signed)
Summary: lab results-   Phone Note Call from Patient Call back at Work Phone [phone]   Caller: Patient Reason for Call: Lab or Test Results Summary of Call: request lab results, call her back after 2:45pm Initial call taken by: Migdalia Dk,  November 23, 2008 2:10 PM  Follow-up for Phone Call        talked with patient-pt stopped Toprol last week because her palpitations were so bad- she states she feels better since stopping Toprol-she is scheduled for monitor and echo next week-will forward to Dr Shirlee Latch for review Katina Dung, RN, BSN  November 23, 2008 4:24 PM

## 2010-06-26 ENCOUNTER — Telehealth: Payer: Self-pay

## 2010-06-26 NOTE — Telephone Encounter (Signed)
Pt called to inquire about date of last Pneumovax. Please call

## 2010-06-26 NOTE — Telephone Encounter (Signed)
OK Dx asthma Thx

## 2010-06-26 NOTE — Telephone Encounter (Signed)
No record of pneumovax - would you like pt to have this vaccine?

## 2010-06-27 NOTE — Telephone Encounter (Signed)
Per patient, she just needed to know for insurance purposes. Per pt "she not really with our group anymore"

## 2010-07-15 NOTE — Assessment & Plan Note (Signed)
[facility]                            CARDIOLOGY OFFICE NOTE   NAME:[name], [name] STODGHILL                      MRN:          [id number]  DATE:12/12/2007                            DOB:          06/14/1955    PRIMARY CARE PHYSICIAN:  [name] Quint. Plotnikov, MD   HISTORY OF PRESENT ILLNESS:  This is a 55 year old with a history of  sensory neuropathy, hypertension and palpitations presents to Cardiology  Clinic for evaluation of her palpitations.  Since she was last seen, we  did a Holter monitor in August which showed occasional premature  ventricular contractions, otherwise no significant arrhythmias.  Her TSH  was checked and was normal.  We did start her on Toprol-XL at that time  given the significance of her symptoms.  The patient does state that her  palpitations have been considerably better since she started Toprol-XL.  She still does get occasional heart fluttering; however, most of her  symptoms have been suppressed by the Toprol-XL.  She does state that  since she has been on Toprol-XL, she feels like she has had some  swelling in her hands and her feet.  She has been on hydrochlorothiazide  in the past for hypertension; however, she appears to have run out at  some point.   MEDICATIONS:  1. Zyrtec.  2. Toprol-XL 25 mg daily.  3. Symbicort 160/4.5 b.i.d.  4. Multivitamin.   EKG today shows normal sinus rhythm.  No ST-T wave changes.  No  premature beats.   Echocardiogram was done in December 2005 showed an EF of 55-65%.  No  regional wall motion abnormalities.  No pulmonary hypertension.  No  significant valvular abnormalities.   SOCIAL HISTORY:  [place]  She does not smoke.  She does not use alcohol.  She does not  use illicit drugs.  She does not take over-the-counter cold medicines  and she is trying to cut back on caffeine.   PAST MEDICAL HISTORY:  1. Hypertension.  2.  Palpitations with an event monitor in April 2006 showing frequent      PVCs and a Holter monitor in August 2009 showing PVCs.  She does      have significant symptoms and so she has been started on Toprol-XL.  3. Asthma.  4. Sensory neuropathy.  This was thought to be possibly post viral.      She has had an extensive workup that has been unrevealing so far.      She has followup at [facility] in the Neurology Department there.   ALLERGIES:  The patient has had angioedema with ACE INHIBITORS in the  past so she should not use ACE INHIBITORS or ANGIOTENSIN RECEPTOR  BLOCKER.  She is also allergic to SULFA.   PHYSICAL EXAMINATION:  VITAL SIGNS:  Blood pressure today is 139/81,  heart rate is 70 and regular.  GENERAL:  No apparent distress.  Well-developed, well-nourished female.  NEUROLOGICAL:  Alert and oriented x3.  Normal affect.  NECK:  No JVD.  No thyromegaly or thyroid nodule.  LUNGS:  Clear to auscultation bilaterally.  HEART:  Regular S1 and S2.  No S3, no S4.  No premature beats.  No  murmur.  No pretibial edema.  There is no carotid bruit.  EXTREMITIES:  No clubbing or cyanosis.  ABDOMEN:  Soft, nontender.  No hepatosplenomegaly.   LABORATORY DATA:  Most recent labs showed a normal TSH.   ASSESSMENT:  This is a 55 year old with history of palpitations due to  premature ventricular contractions.  1. Palpitations.  These seem to be related to frequent premature      ventricular contractions.  Symptoms have been very suppressed with      Toprol-XL.  We will continue this medication.  She did have a      normal echocardiogram in 2005.  She was also having palpitations at      that time as well.  She does have normal TSH as well.  2. Hypertension.  The patient's blood pressure is 139/81.  She does      state that she is out of her hydrochlorothiazide.  We will go ahead      and refill her hydrochlorothiazide 12.5 mg daily.  3. Cardiac risk factors.  The patient does not smoke.   Her most recent      lipids were excellent.  She did have an LDL of 84 and HDL of 63 in      April 2009.     Marca Ancona, MD  Electronically Signed    DM/MedQ  DD: 12/12/2007  DT: 12/12/2007  Job #: [id number]   cc:   [name] Quint. Plotnikov, MD

## 2010-07-15 NOTE — Assessment & Plan Note (Signed)
[facility]                            CARDIOLOGY OFFICE NOTE   NAME:[name] [name], [name] [name]                      MRN:          [id number]  DATE:09/30/2007                            DOB:          11-Oct-1955    PRIMARY CARE PHYSICIAN:  Georgina Quint. Plotnikov, MD.   HISTORY OF PRESENT ILLNESS:  This is a 55 year old with a history of  hypertension, sensory neuropathy, and palpitations who presents to the  Cardiology Clinic for evaluation of her current palpitations.  The  patient was seen in April 2006 at [facility] for palpitations.  An event monitor was put on and she was noted to have frequent PVCs and  bigemini.  She was started on Toprol-XL 25 mg daily at that time, which  appears to have resolved the symptoms.  She also had a normal  echocardiogram in 2005.  The patient states that her Toprol-XL was  stopped in 2007 and she was switched to Norvasc, presumably due to  inadequate control of her blood pressure and she did okay with that  until a few months ago when she started developing palpitations again.  These symptoms were similar to April 2006 episode.  She does not have  palpitations everyday and she notes them more when she is sitting still.  She states they feel like fluttering and occasional strong beats.    INCOMPLETE DICTATION.     Marca Ancona, MD     DM/MedQ  DD: 09/30/2007  DT: 10/01/2007  Job #: [id number]

## 2010-07-15 NOTE — Assessment & Plan Note (Signed)
[facility]                            CARDIOLOGY OFFICE NOTE   NAME:Alaimo, BEVERLYANN BROXTERMAN                      MRN:          [id number]  DATE:09/30/2007                            DOB:          August 31, 1955    PRIMARY CARE PHYSICIAN:  Georgina Quint. Plotnikov, MD.   HISTORY OF PRESENT ILLNESS:  This is a 55 year old with a history of  sensory neuropathy, hypertension, and palpitations who presents to  Cardiology Clinic for evaluation of her current palpitation.  The  patient was seen in April 2006 by [facility] for similar  symptoms.  She was having palpitations.  She has had an event monitor  done that showed frequent PVCs.  She was started on Toprol-XL and her  symptoms resolved.  In 2007, apparently, due to inadequate control of  her blood pressure, her Toprol-XL was stopped and she was begun on  Norvasc.  For over the last several months, she has noted the  palpitations beginning again, she stated that she describes some missed  fluttering and occasional strong beats.  She notes them most when she is  sitting still at rest.  They do not happen everyday.  They are very  similar to what happened in 2006, she denies chest pain.  She denies  dyspnea on exertion with apnea or PND.  No episodes of syncope or  lightheadedness.  Also, of note, the patient had a normal echocardiogram  in December 2005.   MEDICATIONS:  1. Zyrtec.  2. Norvasc 5 mg daily.  3. Symbicort 160/4.5 b.i.d.  4. Multivitamin.   EKG, normal sinus rhythm.  No ST-T wave changes.  No premature beats.  Echocardiogram in December 2005, which showed EF 55-65%.  No regional  wall motion abnormalities.  No pulmonary hypertension.  No significant  valvular abnormalities.   SOCIAL HISTORY:  The patient lives [place]  She does not smoke.  She does not use alcohol.  She does not  use illicit drugs.  She does not take over-the-counter cold  medications.   PAST MEDICAL HISTORY:  1. Hypertension.  2. Palpitations with event monitor in April 2006 showing frequent PVCs      with occasional bigemini.  3. Asthma.  4. Sensory neuropathy.  This was thought to be possibly post viral.      She has had an extensive workup that has been unrevealing so far.      She has planned on following up in [facility] with Neurology      Department there.   ALLERGIES:  The patient is allergic to SULFA.  She had angioedema with  use of ACE INHIBITORS in the past, so should not use ACE INHIBITORS or  ANGIOTENSIN RECEPTOR BLOCKERS.   FAMILY HISTORY:  The patient's father had a myocardial infarction in his  late 66s.   REVIEW OF SYSTEMS:  Negative except as noted in the history of present  illness.   PHYSICAL EXAMINATION:  VITALS:  Blood pressure 122/76, heart rate 72 and  regular.  GENERAL:  No apparent distress.  Well developed and  well nourished.  NEURO:  Alert and oriented.  Normal affect.  NECK:  No JVD.  No carotid bruits.  LUNGS:  Clear to auscultation bilaterally.  HEART:  Regular.  S1 and S2, no premature beats.  No S3, no S4.  No  murmur.  No pretibial edema.  EXTREMITIES:  No clubbing or cyanosis.  ABDOMEN:  Soft and nontender.  No hepatosplenomegaly.   ASSESSMENT:  This is a 55 year old with a history of palpitations due to  premature ventricular contraction first noted in 2006 who presents with  recurrence of palpitations after having stopped Toprol-XL a couple of  years ago.  1. Palpitations.  These most likely represent a recurrence of her      premature ventricular contractions.  She was noted to have      premature ventricular contraction on an event monitor in 2006 and      symptoms resolved with use of Toprol-XL.  The patient denies any      other cardiac symptoms.  She did have a normal echocardiogram in      2005.  Therefore, we will plan on obtaining a Holter monitor which      she will wear this weekend.  I will  also give her prescription for      Toprol-XL 25 mg daily.  She can start that medication the day after      she finishes her 24-hour Holter monitor.  The patient also has had      a normal TSH in April 2009.  2. Hypertension.  The patient's blood pressure under excellent control      today.  3. Cardiac risk factors.  The patient does not smoke.  Her most recent      lipids were excellent.  She does have LDL of 84 and HDL of 63 in      April 2009.  She does have a history of hypertension, however,      which is well controlled.     Marca Ancona, MD  Electronically Signed    DM/MedQ  DD: 09/30/2007  DT: 10/01/2007  Job #: [id number]   cc:   Georgina Quint. Plotnikov, MD  Everardo Beals. Juanda Chance, MD, [facility]

## 2010-07-18 NOTE — Assessment & Plan Note (Signed)
[facility]                           PRIMARY CARE OFFICE NOTE   NAME:[name] [name], [name] [name]                      MRN:          [id number]  DATE:03/11/2006                            DOB:          [birth year]    The patient is a 55 year old female who presents with her husband to  discuss her condition.  She has been having numbness around the mouth,  in the toes, in the hands, apparently had not changed since the time of  her presentation.  The disease mainly progressed in the first 1 or 2  weeks and then so to speak froze in time.  She has good days and bad  days, mostly bad days.  The condition gets worse with prolonged  standing, with walking, typing, et Karie Soda.  Sometimes she drops objects.  No history of hurting herself.  She has not been working.   CURRENT MEDICATIONS:  She took Elavil for 2 to 3 weeks which did not  help, so she stopped.  1. She continues to take vitamin B12 and vitamin D.  2. She is on calcium and multivitamin.  3. Ziac 5/6.1.  4. Premarin 0.9.  5. Provera 10 mg daily days 1 through 12.  6. Singulair 10 mg daily.   PHYSICAL EXAMINATION:  Blood pressure 142/79, pulse 90, temp 98.9,  weight 154 pounds.  She is in no acute distress, face a little flushed.  HEENT:  Moist mucosa.  LUNGS:  Clear, no wheeze or rales.  HEART:  S1, S2, no murmur, no gallop.  ABDOMEN:  Soft, nontender, without organomegaly or masses felt.  LOWER EXTREMITIES:  Without edema.  She is alert, oriented and cooperative.  NEUROLOGIC:  Exam reveals normal symmetric muscle strength and deep  tendon reflexes.  Normal Romberg, normal gait and balance test.   ASSESSMENT AND PLAN:  1. Mostly sensory neuropathy, developed shortly after a respiratory      illness, mostly likely postviral.  No signs of progression.      Multiple tests including LP; MRI of the brain, cervical spine,      back; multiple blood tests were unrevealing. EMG was not consistent      with  Guillain-Barre.  She was seen in consultation by Dr. Pearlean Brownie      from [facility] twice.  She is currently      waiting for a consultation at [facility] [place] [facility] neurology department.   The patient is here today with her husband.  They are frustrated with  the communication with our office and the delay in setting the  appointment with [facility].  We met together with our office manager,  Leida Lauth.  Some of the delay was due to [place] being short-staffed.  We apologized.  Most of the delay was related to the fact that there are  no neurologists in the  vicinity that are covered by her plan, and the fact that initially she  declined consultation at [facility] and decided to go and see Dr. Pearlean Brownie  (out of network).   All questions answered.  She was not charged for  this visit.     Georgina Quint. Plotnikov, MD  Electronically Signed    AVP/MedQ  DD: 03/13/2006  DT: 03/14/2006  Job #: [id number]

## 2010-07-18 NOTE — Discharge Summary (Signed)
NAMEFRANCIE, [name] [name]               ACCOUNT NO.:  [id number]   MEDICAL RECORD NO.:  [id number]          PATIENT TYPE:  OBV   LOCATION:  5531                         FACILITY:  [facility]   PHYSICIAN:  Valerie A. Felicity Coyer, MDDATE OF BIRTH:  1955/04/07   DATE OF ADMISSION:  01/04/2006  DATE OF DISCHARGE:  01/05/2006                                 DISCHARGE SUMMARY   DISCHARGE DIAGNOSES:  1. Paresthesias of unclear etiology.  2. Hypertension.  3. Rule out cervical radiculopathy.   HISTORY OF PRESENT ILLNESS:  Ms. Ciaravino is a 55 year old female who was  admitted on January 04, 2006, with chief complaint of numbness, tingling,  and weakness.  These symptoms had become worse on the 2 or 3 days prior to  admission.  She had been treated with Ceftin recently for an upper  respiratory infection, as well as placed on prednisone for questionable  cervical radicular pain.  The Ceftin was discontinued.  The patient was  admitted to [facility] for further evaluation and treatment.   PAST MEDICAL HISTORY:  1. Hypertension.  2. HISTORY OF DRUG REACTIONS, INCLUDING SULFA DRUGS, ACE INHIBITORS,      AVAPRO, LIDOCAINE, AND QUESTIONABLY CEFTIN.   COURSE OF HOSPITALIZATION:  Problem #1:  Paresthesias of unclear etiology.  The patient was admitted and underwent an MRI of the brain, which was  normal.  She was seen in consultation by Dr. Pearlean Brownie of neurology.  At the  time of this dictation an AMA and a Lyme titer are currently pending.  There  are no indications for any demyelinating plaques noted on the MRI.  Dr.  Pearlean Brownie recommended a spinal tap and MRI of the C spine, which he felt could  be performed as an outpatient.  We have made arrangements for an outpatient  lumbar puncture with followup in the office with Dr. Jacinta Shoe.  MRI  of the C spine will need to be arranged as an outpatient.   Problem #2:  Hypertension.  The patient was changed from Toprol to Norvasc 5  mg p.o. daily,  which is required during this admission.  This will be  continued in outpatient followup.   MEDICATIONS AT DISCHARGE:  1. Prednisone 60 mg p.o. on January 06, 2006; 50 mg p.o. on January 07, 2006; 40 mg p.o. on January 08, 2006; 30 mg p.o. on January 09, 2006;      20 mg p.o. on January 10, 2006; 10 mg p.o. on January 11, 2006; then      discontinue.  2. Zyrtec 10 mg p.o. daily.  3. Norvasc 5 mg p.o. daily.  4. Premarin 0.9 mg p.o. daily.  5. Medroxyprogesterone 10 mg p.o. daily as prior to admission.  6. The patient instructed to stop Toprol.   PERTINENT LABORATORIES AT DISCHARGE:  C3-C4 normal.  UA is negative.  TSH  1.048, ESR 2.0, BUN 17, creatinine 0.8.  Hemoglobin 13.8, hematocrit 41.2,  white blood cell count 15.3 thousand, platelets 282,000.   FOLLOWUP:  The patient is scheduled for a lumbar puncture to be performed on  January 11, 2006, at 9:30 a.m.  She is to arrive at short stay at 8:30 a.m.  and remain n.p.o. after midnight.  She is also instructed not to take any  aspirin products between now and her lumbar puncture.  She is also scheduled  for followup with Dr. Jacinta Shoe on January 13, 2006, at 9:30 a.m.  At this visit, she will need to have the results of her lumbar puncture, as  well as ANA and Lyme titer reviewed.  She is instructed to call Dr.  Posey Rea should she develop worsening numbness, tingling, pain, weakness,  or fever greater than 101.      Sandford Craze, NP      Raenette Rover. Felicity Coyer, MD  Electronically Signed    MO/MEDQ  D:  01/05/2006  T:  01/06/2006  Job:  [id number]   cc:   Georgina Quint. Plotnikov, MD

## 2010-07-18 NOTE — Assessment & Plan Note (Signed)
[facility]                             PRIMARY CARE OFFICE NOTE   NAME:[name] [name], [name] [name]                      MRN:          [id number]  DATE:10/06/2005                            DOB:          [birth year]    HISTORY OF PRESENT ILLNESS:  The patient is a 55 year old female who  presents for wellness examination.   PAST MEDICAL/FAMILY/SOCIAL HISTORY:  As per April 02, 2004, note.   ALLERGIES:  SULFA.   CURRENT MEDICATIONS:  Reviewed.   REVIEW OF SYSTEMS:  Negative for chest pain and shortness of breath.  No  syncope.  Allergy and asthma symptoms controlled.  The rest is negative.  Not much headaches lately.   PHYSICAL EXAMINATION:  VITAL SIGNS:  Blood pressure 137/70, pulse 62,  temperature 97.9, weight 143 pounds.  GENERAL:  No acute distress.  Looks well.  HEENT:  Moist mucosa.  NECK:  Supple.  No thyromegaly or bruits.  LUNGS:  Clear.  No wheeze or rales.  HEART:  No murmurs, rubs or gallops.  ABDOMEN:  Soft, nontender, no organomegaly or masses felt.  EXTREMITIES:  Lower extremities without edema.  NEUROLOGICAL:  She is alert, oriented and cooperative.  Denies being  depressed.   LABORATORY DATA:  Done today and pending.   ASSESSMENT/PLAN:  1. Age/health related issues discussed.  Healthy lifestyle discussed.  She      has been seeing her gynecologist every year.  Lab work pending.  Repeat      exam in 12 months.  2. Asthma.  Given a prescription for Singulair 10 mg daily, albuterol      p.r.n..  3. Allergies.  Zyrtec 10 mg daily.  4. Headaches, rare.  5. Hypertension controlled.   I will see her back in six months.                                  Sonda Primes, MD   AP/MedQ  DD:  10/08/2005  DT:  10/08/2005  Job #:  [id number]

## 2010-07-18 NOTE — Assessment & Plan Note (Signed)
[facility]                                   ON-CALL NOTE   NAME:BIANCOWateen, Varon                      MRN:          [id number]  DATE:10/17/2005                            DOB:          Jan 06, 1956    REFERRING PHYSICIAN:  Sonda Primes, MD   TELEPHONE CALL:  Phone is [phone].  Patient of Sonda Primes, MD   Patient calling because the Toprol 25 mg XL that she sent for mail order did  not come in.  I called in a 30-day supply of Toprol 25 mg ER to [facility] with 6 refills.  Instructed the patient in the future, when she gets  her mail order prescriptions, to have Dr. Jonny Ruiz write duplicate prescription  for local pharmacy when the mail order does not work.                                   Jeffrey A. Tawanna Cooler, MD   JAT/MedQ  DD:  10/17/2005  DT:  10/17/2005  Job #:  [id number]

## 2010-07-18 NOTE — Discharge Summary (Signed)
NAMESYRIANA, [name] [name]               ACCOUNT NO.:  [id number]   MEDICAL RECORD NO.:  [id number]          PATIENT TYPE:  OBV   LOCATION:  5531                         FACILITY:  [facility]   PHYSICIAN:  Georgina Quint. Plotnikov, MDDATE OF BIRTH:  [birth year]   DATE OF ADMISSION:  01/04/2006  DATE OF DISCHARGE:  01/05/2006                                 DISCHARGE SUMMARY   DISCHARGE DIAGNOSES:  1. Paresthesias of unclear etiology.  2. Hypertension.   HISTORY OF PRESENT ILLNESS:  [name] was a 55 year old female who was  admitted on January 04, 2006 with a chief complaint of paresthesias as well  as weakness.  The paresthesias were noted as numbness on the tip of the  tongue, left side of the nose, left lip, fingers and toes.  She noted that  these symptoms worsened over two to three days prior to admission.  She had  been treated with prednisone as an outpatient.   Dictation ended at this point.      Sandford Craze, NP    ______________________________  Georgina Quint Plotnikov, MD    MO/MEDQ  D:  01/05/2006  T:  01/06/2006  Job:  [id number]

## 2010-07-18 NOTE — Consult Note (Signed)
NAMEJOLEENA, WEISENBURGER               ACCOUNT NO.:  [id number]   MEDICAL RECORD NO.:  [id number]          PATIENT TYPE:  INP   LOCATION:  5531                         FACILITY:  [facility]   PHYSICIAN:  Pramod P. Pearlean Brownie, MD    DATE OF BIRTH:  [birth year]   DATE OF CONSULTATION:  01/04/2006  DATE OF DISCHARGE:                                   CONSULTATION   REFERRING PHYSICIAN:  Georgina Quint. Plotnikov, MD.   REASON FOR REFERRAL:  Paresthesias.   HISTORY OF PRESENT ILLNESS:  Ms. Iracheta is a 55 year old pleasant Caucasian  lady who has been having progressive extremity paresthesias for the last 9  days.  The patient states she woke up last Saturday from sleep with right  hand paresthesias that she describes as a tingling sensation in her fingers  as well as some numbness of her thumb extending up to the elbow.  This has  essentially remained unchanged since then without fluctuation or worsening.  Three days later she noticed a similar numbness in the left hand as well.  A  few days later she noticed some tingling and numbness in the left foot, and  for the last 2 days she has noticed some tingling in her upper lip as well  as left corner of the mouth.  She denies any other focal neurological  symptoms in the form of extremity weakness, gait, balance difficulties,  slurred speech, blurred vision, double vision, vertigo.  She has no known  prior history of multiple sclerosis-like episodes.  She has no known prior  neurological history.  There have been no recent medication changes.  She  had a dental visit a few weeks ago.  She had some sinus congestion 10 days  ago, for which she has been treated with steroids and Singulair.   Her past medical history significant for angioedema in the past.  Hypertension.   MEDICATION ALLERGIES:  SULFA.   PAST SURGICAL HISTORY:  Tubal ligation and breast implants.   HOME MEDICATIONS:  Premarin, Provera, Toprol, Singulair, prednisone being  tapered.   SOCIAL HISTORY:  The patient is married, lives with husband in [place].  She  works in an office.  She does not smoke or drink.   REVIEW OF SYSTEMS:  As documented above, positive for some sinus congestion,  shortness of breath, numbness, tingling.   PHYSICAL EXAMINATION:  GENERAL:  A young Caucasian lady who is not in  distress.  VITAL SIGNS:  She is afebrile, pulse rate of 70 per minute and regular.  Distal pulses are well-felt.  Blood pressure is 139/80, respiratory 20 per  minute, height 5 feet 2 inches.  Oxygen saturation is 95% on room air.  HEENT:  Head is nontraumatic.  Neck is supple without bruit.  ENT exam  unremarkable.  CARDIAC:  No murmur or gallop.  LUNGS:  Clear to auscultation.  NEUROLOGIC:  The patient is pleasant, awake, alert and cooperative.  There  is no aphasia, apraxia or dysarthria.  Pupils are equal, reactive.  Eye  movements are full range without nystagmus.  Visual acuity appears adequate.  Face is  symmetric.  Palatal movements are normal.  Tongue is midline.  Motor  system exam reveals no upper extremity drift, symmetric strength and tone.  Reflexes including ankle jerks are easily elicited though slightly depressed  compared to the knee jerks, which are brisk.  Plantars are downgoing.  There  is good finger-to-nose and __________ coordination, and gait is slow and  steady.  She can even walk tandem easily.   DATA REVIEWED:  Dr. Loren Racer office note from today is reviewed.  Admission labs are pending at this time.  The patient apparently had a CT  scan of the brain and C-spine done last week, which were apparently normal  though actual reports are not available.   IMPRESSION:  A 55-year lady with progressive for three- extremity and face  paresthesias of undetermined etiology.  In patient this age, possibilities  include demyelinating disease versus forme fruste of Guillain-Barre  syndrome.  Anxiety and stress may also be contributing.   PLAN:  Agree  with admission for further evaluation.  Check labs for  vasculitis, Lyme disease, thyroid function tests.  MRI scan of the brain  with and without contrast for demyelinating disease.  If this is positive,  may need further imaging of the spine.  If the MRI is negative for  demyelinating disease, then may consider EMG nerve conduction studies as  well as spinal tap later.  At the present time, her paresthesias are not  severe enough to justify specific medications.  If they get worse, may  consider medicines like Neurontin or Topamax in the future.  I would be  happy to follow the patient on consult.  Kindly call for questions.  I had a  long discussion with the patient and her husband and answered questions.  Thank you for the referral.           ______________________________  Sunny Schlein. Pearlean Brownie, MD     PPS/MEDQ  D:  01/04/2006  T:  01/05/2006  Job:  [id number]

## 2010-07-18 NOTE — H&P (Signed)
NAMECHARLESTON, VIERLING               ACCOUNT NO.:  [id number]   MEDICAL RECORD NO.:  [id number]          PATIENT TYPE:  INP   LOCATION:  5531                         FACILITY:  [facility]   PHYSICIAN:  Georgina Quint. Plotnikov, MDDATE OF BIRTH:  09-30-1955   DATE OF ADMISSION:  01/04/2006  DATE OF DISCHARGE:                                HISTORY & PHYSICAL   DATE OF BIRTH:  05/20/1955   CHIEF COMPLAINT:  Weakness, numbness of the tip of the tongue, left side of  the nose, left lip, fingers, and toes, and palpitation and weakness.   HISTORY OF PRESENT ILLNESS:  The patient is a 55 year old female who was  seen today, again with the above symptoms.  It worsened over the past  several 2-3 days.  Prior to that she was seen on January 01, 2006, with  tingling in the hands, arms and toes, and swelling in the lower lip.  Prior  to that, I treated her with Ceftin for upper respiratory tract infection.  I  felt that these symptoms could be related to Ceftin.  She was advised to  discontinue Ceftin and continue on prednisone taper that she received  earlier for neck pain and pain and numbness in the right arm, thinking that  she has a problem with radiculopathy.  In spite of stopping the Ceftin, her  symptoms have progressed as described above.  Today she is feeling worse.  Also describes a couple episodes of palpitations.   ALLERGIES:  SULFA DRUGS; ACE INHIBITORS (ANGIOEDEMA); AVAPRO CAUSED  PALPITATIONS; LIDOCAINE SEEMS TO GIVE ANGIOEDEMA, ESPECIALLY FOR DENTAL  WORK; POSSIBLE CEFTIN.   CURRENT MEDICINES:  Toprol 25 mg daily.  She has been on it for several  months without any problem.   PAST MEDICAL HISTORY:  1. Hypertension.  2. History of drug reactions as described above.   SOCIAL HISTORY:  She does not smoke or drink.   FAMILY HISTORY:  Positive for coronary artery disease in her mother and  father.   REVIEW OF SYSTEMS:  As above.  Neck pain seems to be better.  No syncopal  spells.  Flushed cheeks over the past several days improved.  Palpitations  as above.  No chest pain.  The rest is negative.   PHYSICAL EXAMINATION:  VITAL SIGNS:  Blood pressure 140/100, pulse 80,  respirations 12.  GENERAL:  She is in no acute distress.  Does not appear anxious.  HEENT:  With moist mucosa.  No erythema or hives or ulcers.  The lower  border of the bottom lip seems to be slightly pussy.  Flushed cheeks without  papules.  Eyes normal.  LUNGS:  Clear.  HEART:  Regular S1 and S2, slightly tachycardic.  NECK:  No thyromegaly.  ABDOMEN:  Soft, nontender, no organomegaly, no masses.  EXTREMITIES:  Lower extremities without edema.  Muscle strength seems to be  intact.  Deep tendon reflexes are symmetric and normal.  NEUROLOGICAL:  Sensory exam is fairly normal.  She is alert, oriented, and  cooperative.   ASSESSMENT AND PLAN:  1. Progressive paresthesias currently involving the left  side of the nose,      lower lip, tongue, fingertips, and toes.  Unclear etiology.  Rule out      neurologic disease.  Obtain MRI of the brain.  Obtain neurology      consult.  Theoretically it could be all related to potential drug      allergy with the main suspect being Toprol at present.  I will hold      Toprol.  2. Recent cervical pain with possible right upper extremity radiculopathy.      Will switch to IV Solu-Medrol.  Her C-spine MRI was scheduled as an      outpatient.  X-ray shows some arthritis present.  3. Recent angioedema from lidocaine, history of angioedema from ACE      inhibitors.  Plan as above.  4. Hypertension.  Will start Norvasc if her blood pressure gets greater      than 150.  Will also obtain blood work.           ______________________________  Georgina Quint. Plotnikov, MD     AVP/MEDQ  D:  01/04/2006  T:  01/05/2006  Job:  [id number]

## 2010-09-01 ENCOUNTER — Other Ambulatory Visit: Payer: Self-pay | Admitting: Obstetrics and Gynecology

## 2010-09-01 DIAGNOSIS — Z1231 Encounter for screening mammogram for malignant neoplasm of breast: Secondary | ICD-10-CM

## 2010-10-16 ENCOUNTER — Ambulatory Visit (HOSPITAL_COMMUNITY)
Admission: RE | Admit: 2010-10-16 | Discharge: 2010-10-16 | Disposition: A | Discharge status: Home / Self Care | Payer: BC Managed Care – PPO | Source: Ambulatory Visit | Attending: Obstetrics and Gynecology | Admitting: Obstetrics and Gynecology

## 2010-10-16 DIAGNOSIS — Z1231 Encounter for screening mammogram for malignant neoplasm of breast: Secondary | ICD-10-CM | POA: Insufficient documentation

## 2011-02-12 ENCOUNTER — Ambulatory Visit (INDEPENDENT_AMBULATORY_CARE_PROVIDER_SITE_OTHER): Payer: BC Managed Care – PPO | Admitting: Surgery

## 2011-02-12 ENCOUNTER — Encounter (INDEPENDENT_AMBULATORY_CARE_PROVIDER_SITE_OTHER): Payer: Self-pay | Admitting: Surgery

## 2011-02-12 VITALS — BP 152/96 | HR 64 | Temp 97.9°F | Resp 12 | Ht 61.0 in | Wt 151.6 lb

## 2011-02-12 DIAGNOSIS — K644 Residual hemorrhoidal skin tags: Secondary | ICD-10-CM

## 2011-02-12 DIAGNOSIS — L290 Pruritus ani: Secondary | ICD-10-CM

## 2011-02-12 MED ORDER — FLUOCINONIDE-E 0.05 % EX CREA: TOPICAL_CREAM | Freq: Two times a day (BID) | CUTANEOUS | Status: DC

## 2011-02-12 MED ORDER — FLUCONAZOLE 200 MG PO TABS: 200.0000 mg | ORAL_TABLET | Freq: Every day | ORAL | Status: AC

## 2011-02-13 NOTE — Progress Notes (Addendum)
Subjective:     Patient ID: [name] [name], female   DOB: Jul 26, 1955, 55 y.o.   MRN: [id number]  HPI  [name] [name]  [birth year] [id number]  Patient Care Team: Darnelle Bos, MD as PCP - General (Internal Medicine)  This patient is a 55 y.o.female who presents today for surgical evaluation at the request of Dr. Earl Gala.   She has had perianal burning and itching and irritation. She feels a lump on the vagina side as well. She was one initiate hemorrhoid. She was sent to [place] for concerns of other issues. Normally has a bowel movement about every day. No constipation or diarrhea. Using cottonelle toilet paper. No change in laundry or soaps  Patient Active Problem List  Diagnoses  . NEUROPATHY, OTHER INFLAMMATORY AND TOXIC  . HYPERTENSION  . SINUSITIS, ACUTE  . ALLERGIC RHINITIS  . ASTHMA  . WRIST PAIN  . HIP PAIN  . HEADACHE  . UNSPECIFIED TACHYCARDIA  . PALPITATIONS  . Pruritus ani  . External hemorrhoids with complication    Past Medical History  Diagnosis Date  . Hypertension   . Asthma     Past Surgical History  Procedure Date  . Breast enhancement surgery 1999  . Tubal ligation 1993    History   Social History  . Marital Status: Married    Spouse Name: N/A    Number of Children: N/A  . Years of Education: N/A   Occupational History  . Not on file.   Social History Main Topics  . Smoking status: Never Smoker   . Smokeless tobacco: Not on file  . Alcohol Use: No  . Drug Use: No  . Sexually Active:    Other Topics Concern  . Not on file   Social History Narrative  . No narrative on file    Family History  Problem Relation Age of Onset  . Cancer Maternal Aunt     breast    Current outpatient prescriptions:amLODipine (NORVASC) 5 MG tablet, daily., Disp: , Rfl: ;  calcium carbonate (OS-CAL) 600 MG TABS, Take 600 mg by mouth daily.  , Disp: , Rfl: ;  cetirizine (ZYRTEC) 10 MG tablet, Take 10 mg by mouth daily.  , Disp: , Rfl: ;  Multiple  Vitamin (MULTIVITAMIN) capsule, Take 1 capsule by mouth daily.  , Disp: , Rfl: ;  predniSONE (DELTASONE) 20 MG tablet, Ad lib., Disp: , Rfl:  PROAIR HFA 108 (90 BASE) MCG/ACT inhaler, Ad lib., Disp: , Rfl: ;  SYMBICORT 160-4.5 MCG/ACT inhaler, daily., Disp: , Rfl: ;  fluconazole (DIFLUCAN) 200 MG tablet, Take 1 tablet (200 mg total) by mouth daily., Disp: 3 tablet, Rfl: 2;  fluocinonide-emollient (LIDEX-E) 0.05 % cream, Apply topically 2 (two) times daily. Apply to irritated area x 5days, Disp: 15 g, Rfl: 1  Allergies  Allergen Reactions  . Ace Inhibitors     REACTION: angio edema  . Cefuroxime Axetil   . Irbesartan     REACTION: papitations  . Sulfonamide Derivatives     REACTION: Rash    BP 152/96  Pulse 64  Temp(Src) 97.9 F (36.6 C) (Temporal)  Resp 12  Ht 5\' 1"  (1.549 m)  Wt 151 lb 9.6 oz (68.765 kg)  BMI 28.64 kg/m2     Review of Systems  Constitutional: Negative for fever, chills, diaphoresis, appetite change and fatigue.  HENT: Negative for ear pain, sore throat, trouble swallowing, neck pain and ear discharge.   Eyes: Negative for photophobia, discharge and visual disturbance.  Respiratory: Negative for cough, choking, chest tightness and shortness of breath.   Cardiovascular: Negative for chest pain and palpitations.  Gastrointestinal: Positive for rectal pain. Negative for nausea, vomiting, abdominal pain, diarrhea, constipation, blood in stool, abdominal distention and anal bleeding.  Genitourinary: Negative for dysuria, urgency, frequency, vaginal bleeding, enuresis and difficulty urinating.  Musculoskeletal: Negative for myalgias and gait problem.  Skin: Negative for color change, pallor and rash.  Neurological: Negative for dizziness, speech difficulty, weakness and numbness.  Hematological: Negative for adenopathy.  Psychiatric/Behavioral: Negative for confusion and agitation. The patient is not nervous/anxious.        Objective:   Physical Exam    Constitutional: She is oriented to person, place, and time. She appears well-developed and well-nourished. No distress.  HENT:  Head: Normocephalic.  Mouth/Throat: Oropharynx is clear and moist. No oropharyngeal exudate.  Eyes: Conjunctivae and EOM are normal. Pupils are equal, round, and reactive to light. No scleral icterus.  Neck: Normal range of motion. Neck supple. No tracheal deviation present.  Cardiovascular: Normal rate, regular rhythm and intact distal pulses.   Pulmonary/Chest: Effort normal and breath sounds normal. No respiratory distress. She exhibits no tenderness.  Abdominal: Soft. She exhibits no distension and no mass. There is no tenderness. There is no rebound and no guarding. Hernia confirmed negative in the right inguinal area and confirmed negative in the left inguinal area.  Genitourinary: Vagina normal.    No vaginal discharge found.       Perianal skin clean with good hygiene.  ++ pruritis.  No pilonidal disease.  No fissure.  No abscess/fistula.    Tolerates digital and anoscopic rectal exam.  Normal sphincter tone.  No rectal masses.  Hemorrhoidal piles WNL   Musculoskeletal: Normal range of motion. She exhibits no tenderness.  Lymphadenopathy:    She has no cervical adenopathy.       Right: No inguinal adenopathy present.       Left: No inguinal adenopathy present.  Neurological: She is alert and oriented to person, place, and time. No cranial nerve deficit. She exhibits normal muscle tone. Coordination normal.  Skin: Skin is warm and dry. No rash noted. She is not diaphoretic. No erythema.  Psychiatric: She has a normal mood and affect. Her behavior is normal. Judgment and thought content normal.       Assessment:     Prob pruritis ani >> mildly irritated ext hem    Plan:     The anatomy and physiology of the perianal and anorectal regions was discussed. Pathophysiology of pruritus ani with symptoms of itching, irritating, cracking, bleeding, etc.  discussed. Risks of bacterial and fungal/yeast infections on top of this was discussed.  Discussion about dietary changes, bowel regimen, minimizing friction trauma (scratching it, dry toilet paper, rubbing) to the area were discussed. Use of wet wipes and hypoallergenic medications was discussed.  The key is to keep the area clean and dry but avoid over moisturization and avoid severely dry skin, keep a happy medium range. Possible need for a short course of antifungal topical medication and/or high/low potency steroids were discussed as well.  Handout explaining pathophysiology & treatment was given to the patient as well. Possible need for a biopsy may be needed later on as well for further workup and diagnosis.  Lidex High pot topical steroid x5d.  Oral fluc x 3 doses.  RTC 3-4 weeks to see if improved  The patient expressed understanding and appreciation. We will try and manage this nonoperatively first.

## 2011-03-05 ENCOUNTER — Encounter (INDEPENDENT_AMBULATORY_CARE_PROVIDER_SITE_OTHER): Payer: Self-pay | Admitting: Surgery

## 2011-03-05 ENCOUNTER — Ambulatory Visit (INDEPENDENT_AMBULATORY_CARE_PROVIDER_SITE_OTHER): Payer: BC Managed Care – PPO | Admitting: Surgery

## 2011-03-05 VITALS — BP 142/88 | HR 68 | Temp 98.2°F | Resp 12 | Ht 61.0 in | Wt 152.2 lb

## 2011-03-05 DIAGNOSIS — L290 Pruritus ani: Secondary | ICD-10-CM

## 2011-03-05 NOTE — Progress Notes (Signed)
Subjective:     Patient ID: [name] [name], female   DOB: 05-30-1955, 56 y.o.   MRN: [id number]  HPI   [name] [name]  10-Oct-1955 [id number]  Patient Care Team: Darnelle Bos, MD as PCP - General (Internal Medicine)  This patient is a 56 y.o.female who presents today for surgical evaluation.   Patient's perianal burning/itching and irritation have resolved. She finished the topical steroid & oral fluconazole.  BM every day. No constipation or diarrhea. Using wet wipes now - milder. No change in laundry or soaps  Patient Active Problem List  Diagnoses  . NEUROPATHY, OTHER INFLAMMATORY AND TOXIC  . HYPERTENSION  . SINUSITIS, ACUTE  . ALLERGIC RHINITIS  . ASTHMA  . WRIST PAIN  . HIP PAIN  . HEADACHE  . UNSPECIFIED TACHYCARDIA  . PALPITATIONS  . Pruritus ani  . External hemorrhoids with complication    Past Medical History  Diagnosis Date  . Hypertension   . Asthma   . Pruritus ani     Past Surgical History  Procedure Date  . Breast enhancement surgery 1999  . Tubal ligation 1993    History   Social History  . Marital Status: Married    Spouse Name: N/A    Number of Children: N/A  . Years of Education: N/A   Occupational History  . Not on file.   Social History Main Topics  . Smoking status: Never Smoker   . Smokeless tobacco: Not on file  . Alcohol Use: No  . Drug Use: No  . Sexually Active:    Other Topics Concern  . Not on file   Social History Narrative  . No narrative on file    Family History  Problem Relation Age of Onset  . Cancer Maternal Aunt     breast    Current outpatient prescriptions:amLODipine (NORVASC) 5 MG tablet, daily., Disp: , Rfl: ;  calcium carbonate (OS-CAL) 600 MG TABS, Take 600 mg by mouth daily.  , Disp: , Rfl: ;  cetirizine (ZYRTEC) 10 MG tablet, Take 10 mg by mouth daily.  , Disp: , Rfl: ;  Multiple Vitamin (MULTIVITAMIN) capsule, Take 1 capsule by mouth daily.  , Disp: , Rfl: ;  PROAIR HFA 108 (90 BASE)  MCG/ACT inhaler, Ad lib., Disp: , Rfl:  SYMBICORT 160-4.5 MCG/ACT inhaler, daily., Disp: , Rfl:   Allergies  Allergen Reactions  . Ace Inhibitors     REACTION: angio edema  . Cefuroxime Axetil   . Irbesartan     REACTION: papitations  . Sulfonamide Derivatives     REACTION: Rash    BP 142/88  Pulse 68  Temp(Src) 98.2 F (36.8 C) (Temporal)  Resp 12  Ht 5\' 1"  (1.549 m)  Wt 152 lb 3.2 oz (69.037 kg)  BMI 28.76 kg/m2     Review of Systems  Constitutional: Negative for fever, chills, diaphoresis, appetite change and fatigue.  HENT: Negative for ear pain, sore throat, trouble swallowing, neck pain and ear discharge.   Eyes: Negative for photophobia, discharge and visual disturbance.  Respiratory: Negative for cough, choking, chest tightness and shortness of breath.   Cardiovascular: Negative for chest pain and palpitations.  Gastrointestinal: Negative for nausea, vomiting, abdominal pain, diarrhea, constipation, blood in stool, abdominal distention, anal bleeding and rectal pain.  Genitourinary: Negative for dysuria, urgency, frequency, vaginal bleeding, enuresis and difficulty urinating.  Musculoskeletal: Negative for myalgias and gait problem.  Skin: Negative for color change, pallor and rash.  Neurological: Negative for dizziness, speech  difficulty, weakness and numbness.  Hematological: Negative for adenopathy.  Psychiatric/Behavioral: Negative for confusion and agitation. The patient is not nervous/anxious.        Objective:   Physical Exam  Constitutional: She is oriented to person, place, and time. She appears well-developed and well-nourished. No distress.  HENT:  Head: Normocephalic.  Mouth/Throat: Oropharynx is clear and moist. No oropharyngeal exudate.  Eyes: Conjunctivae and EOM are normal. Pupils are equal, round, and reactive to light. No scleral icterus.  Neck: Normal range of motion. Neck supple. No tracheal deviation present.  Cardiovascular: Normal rate,  regular rhythm and intact distal pulses.   Pulmonary/Chest: Effort normal and breath sounds normal. No respiratory distress. She exhibits no tenderness.  Abdominal: Soft. She exhibits no distension and no mass. There is no tenderness. There is no rebound and no guarding. Hernia confirmed negative in the right inguinal area and confirmed negative in the left inguinal area.  Genitourinary: Vagina normal.    No vaginal discharge found.       Perianal skin clean with good hygiene.  Pruritis resolved.  No pilonidal disease.  No fissure.  No abscess/fistula.    Tolerates digital and anoscopic rectal exam.  Normal sphincter tone.  No rectal masses.  Hemorrhoidal piles WNL   Musculoskeletal: Normal range of motion. She exhibits no tenderness.  Lymphadenopathy:    She has no cervical adenopathy.       Right: No inguinal adenopathy present.       Left: No inguinal adenopathy present.  Neurological: She is alert and oriented to person, place, and time. No cranial nerve deficit. She exhibits normal muscle tone. Coordination normal.  Skin: Skin is warm and dry. No rash noted. She is not diaphoretic. No erythema.  Psychiatric: She has a normal mood and affect. Her behavior is normal. Judgment and thought content normal.       Assessment:     Pruritis ani markedly improved    Plan:     I. lead that she is better. I think the small skin fold of the anus is just a mild anatomical variant. He is nothing to be of concern. It does not bother her. She is just curious about it. It is a subtle at best now  Bowel regimen & hygiene as before to avoid problems.  If she gets another flare, try Lidex for 3 days. If it returns or does not improve, we should see her to doublecheck things. I warned her against using steroids or long period of time. She was very compliant just using for 5 days.  Return to clinic p.r.n.   The patient expressed understanding and appreciation  .

## 2011-10-16 ENCOUNTER — Other Ambulatory Visit: Payer: Self-pay | Admitting: Obstetrics and Gynecology

## 2011-10-16 DIAGNOSIS — Z1231 Encounter for screening mammogram for malignant neoplasm of breast: Secondary | ICD-10-CM

## 2011-11-12 ENCOUNTER — Ambulatory Visit (HOSPITAL_COMMUNITY)
Admission: RE | Admit: 2011-11-12 | Discharge: 2011-11-12 | Disposition: A | Discharge status: Home / Self Care | Payer: BC Managed Care – PPO | Source: Ambulatory Visit | Attending: Obstetrics and Gynecology | Admitting: Obstetrics and Gynecology

## 2011-11-12 DIAGNOSIS — Z1231 Encounter for screening mammogram for malignant neoplasm of breast: Secondary | ICD-10-CM

## 2011-11-16 LAB — HM PAP SMEAR: HM Pap smear: NEGATIVE

## 2011-11-27 ENCOUNTER — Encounter: Payer: Self-pay | Admitting: Cardiology

## 2012-08-02 ENCOUNTER — Other Ambulatory Visit: Payer: Self-pay | Admitting: Obstetrics and Gynecology

## 2012-08-02 DIAGNOSIS — Z1231 Encounter for screening mammogram for malignant neoplasm of breast: Secondary | ICD-10-CM

## 2012-11-17 ENCOUNTER — Other Ambulatory Visit: Payer: Self-pay | Admitting: Obstetrics and Gynecology

## 2012-11-17 ENCOUNTER — Ambulatory Visit (HOSPITAL_COMMUNITY)
Admission: RE | Admit: 2012-11-17 | Discharge: 2012-11-17 | Disposition: A | Discharge status: Home / Self Care | Payer: BC Managed Care – PPO | Source: Ambulatory Visit | Attending: Obstetrics and Gynecology | Admitting: Obstetrics and Gynecology

## 2012-11-17 DIAGNOSIS — Z1231 Encounter for screening mammogram for malignant neoplasm of breast: Secondary | ICD-10-CM

## 2012-11-22 ENCOUNTER — Encounter: Payer: Self-pay | Admitting: Obstetrics and Gynecology

## 2012-11-23 ENCOUNTER — Ambulatory Visit: Payer: Self-pay | Admitting: Obstetrics and Gynecology

## 2012-11-23 ENCOUNTER — Ambulatory Visit (INDEPENDENT_AMBULATORY_CARE_PROVIDER_SITE_OTHER): Payer: BC Managed Care – PPO | Admitting: Obstetrics and Gynecology

## 2012-11-23 ENCOUNTER — Encounter: Payer: Self-pay | Admitting: Obstetrics and Gynecology

## 2012-11-23 VITALS — BP 136/80 | HR 70 | Ht 61.0 in | Wt 153.0 lb

## 2012-11-23 DIAGNOSIS — Z0000 Encounter for general adult medical examination without abnormal findings: Secondary | ICD-10-CM

## 2012-11-23 DIAGNOSIS — Z01419 Encounter for gynecological examination (general) (routine) without abnormal findings: Secondary | ICD-10-CM

## 2012-11-23 DIAGNOSIS — Z23 Encounter for immunization: Secondary | ICD-10-CM

## 2012-11-23 LAB — POCT URINALYSIS DIPSTICK
Bilirubin, UA: NEGATIVE
Blood, UA: NEGATIVE
Glucose, UA: NEGATIVE
Ketones, UA: NEGATIVE
Leukocytes, UA: NEGATIVE
Nitrite, UA: NEGATIVE
Protein, UA: NEGATIVE
Urobilinogen, UA: NEGATIVE
pH, UA: 7

## 2012-11-23 NOTE — Patient Instructions (Signed)
Tetanus, Diphtheria, Pertussis (Tdap) Vaccine What You Need to Know WHY GET VACCINATED? Tetanus, diphtheria and pertussis can be very serious diseases, even for adolescents and adults. Tdap vaccine can protect us from these diseases. TETANUS (Lockjaw) causes painful muscle tightening and stiffness, usually all over the body.  It can lead to tightening of muscles in the head and neck so you can't open your mouth, swallow, or sometimes even breathe. Tetanus kills about 1 out of 5 people who are infected. DIPHTHERIA can cause a thick coating to form in the back of the throat.  It can lead to breathing problems, paralysis, heart failure, and death. PERTUSSIS (Whooping Cough) causes severe coughing spells, which can cause difficulty breathing, vomiting and disturbed sleep.  It can also lead to weight loss, incontinence, and rib fractures. Up to 2 in 100 adolescents and 5 in 100 adults with pertussis are hospitalized or have complications, which could include pneumonia and death. These diseases are caused by bacteria. Diphtheria and pertussis are spread from person to person through coughing or sneezing. Tetanus enters the body through cuts, scratches, or wounds. Before vaccines, the [place] saw as many as 200,000 cases a year of diphtheria and pertussis, and hundreds of cases of tetanus. Since vaccination began, tetanus and diphtheria have dropped by about 99% and pertussis by about 80%. TDAP VACCINE Tdap vaccine can protect adolescents and adults from tetanus, diphtheria, and pertussis. One dose of Tdap is routinely given at age 11 or 12. People who did not get Tdap at that age should get it as soon as possible. Tdap is especially important for health care professionals and anyone having close contact with a baby younger than 12 months. Pregnant women should get a dose of Tdap during every pregnancy, to protect the newborn from pertussis. Infants are most at risk for severe, life-threatening  complications from pertussis. A similar vaccine, called Td, protects from tetanus and diphtheria, but not pertussis. A Td booster should be given every 10 years. Tdap may be given as one of these boosters if you have not already gotten a dose. Tdap may also be given after a severe cut or burn to prevent tetanus infection. Your doctor can give you more information. Tdap may safely be given at the same time as other vaccines. SOME PEOPLE SHOULD NOT GET THIS VACCINE  If you ever had a life-threatening allergic reaction after a dose of any tetanus, diphtheria, or pertussis containing vaccine, OR if you have a severe allergy to any part of this vaccine, you should not get Tdap. Tell your doctor if you have any severe allergies.  If you had a coma, or long or multiple seizures within 7 days after a childhood dose of DTP or DTaP, you should not get Tdap, unless a cause other than the vaccine was found. You can still get Td.  Talk to your doctor if you:  have epilepsy or another nervous system problem,  had severe pain or swelling after any vaccine containing diphtheria, tetanus or pertussis,  ever had Guillain-Barr Syndrome (GBS),  aren't feeling well on the day the shot is scheduled. RISKS OF A VACCINE REACTION With any medicine, including vaccines, there is a chance of side effects. These are usually mild and go away on their own, but serious reactions are also possible. Brief fainting spells can follow a vaccination, leading to injuries from falling. Sitting or lying down for about 15 minutes can help prevent these. Tell your doctor if you feel dizzy or light-headed, or   have vision changes or ringing in the ears. Mild problems following Tdap (Did not interfere with activities)  Pain where the shot was given (about 3 in 4 adolescents or 2 in 3 adults)  Redness or swelling where the shot was given (about 1 person in 5)  Mild fever of at least 100.10F (up to about 1 in 25 adolescents or 1 in  100 adults)  Headache (about 3 or 4 people in 10)  Tiredness (about 1 person in 3 or 4)  Nausea, vomiting, diarrhea, stomach ache (up to 1 in 4 adolescents or 1 in 10 adults)  Chills, body aches, sore joints, rash, swollen glands (uncommon) Moderate problems following Tdap (Interfered with activities, but did not require medical attention)  Pain where the shot was given (about 1 in 5 adolescents or 1 in 100 adults)  Redness or swelling where the shot was given (up to about 1 in 16 adolescents or 1 in 25 adults)  Fever over 102F (about 1 in 100 adolescents or 1 in 250 adults)  Headache (about 3 in 20 adolescents or 1 in 10 adults)  Nausea, vomiting, diarrhea, stomach ache (up to 1 or 3 people in 100)  Swelling of the entire arm where the shot was given (up to about 3 in 100). Severe problems following Tdap (Unable to perform usual activities, required medical attention)  Swelling, severe pain, bleeding and redness in the arm where the shot was given (rare). A severe allergic reaction could occur after any vaccine (estimated less than 1 in a million doses). WHAT IF THERE IS A SERIOUS REACTION? What should I look for?  Look for anything that concerns you, such as signs of a severe allergic reaction, very high fever, or behavior changes. Signs of a severe allergic reaction can include hives, swelling of the face and throat, difficulty breathing, a fast heartbeat, dizziness, and weakness. These would start a few minutes to a few hours after the vaccination. What should I do?  If you think it is a severe allergic reaction or other emergency that can't wait, call 9-1-1 or get the person to the nearest hospital. Otherwise, call your doctor.  Afterward, the reaction should be reported to the "Vaccine Adverse Event Reporting System" (VAERS). Your doctor might file this report, or you can do it yourself through the VAERS web site at www.vaers.LAgents.no, or by calling 1-6416186718. VAERS is  only for reporting reactions. They do not give medical advice.  THE NATIONAL VACCINE INJURY COMPENSATION PROGRAM The National Vaccine Injury Compensation Program (VICP) is a federal program that was created to compensate people who may have been injured by certain vaccines. Persons who believe they may have been injured by a vaccine can learn about the program and about filing a claim by calling 1-484-844-3403 or visiting the VICP website at SpiritualWord.at. HOW CAN I LEARN MORE?  Ask your doctor.  Call your local or state health department.  Contact the Centers for Disease Control and Prevention (CDC):  Call (561)130-4395 or visit CDC's website at PicCapture.uy. CDC Tdap Vaccine VIS (07/09/11) Document Released: 08/18/2011 Document Revised: 11/11/2011 Document Reviewed: 08/18/2011 ExitCare Patient Information 2014 [place], [place].  EXERCISE AND DIET:  We recommended that you start or continue a regular exercise program for good health. Regular exercise means any activity that makes your heart beat faster and makes you sweat.  We recommend exercising at least 30 minutes per day at least 3 days a week, preferably 4 or 5.  We also recommend a diet low  in fat and sugar.  Inactivity, poor dietary choices and obesity can cause diabetes, heart attack, stroke, and kidney damage, among others.    ALCOHOL AND SMOKING:  Women should limit their alcohol intake to no more than 7 drinks/beers/glasses of wine (combined, not each!) per week. Moderation of alcohol intake to this level decreases your risk of breast cancer and liver damage. And of course, no recreational drugs are part of a healthy lifestyle.  And absolutely no smoking or even second hand smoke. Most people know smoking can cause heart and lung diseases, but did you know it also contributes to weakening of your bones? Aging of your skin?  Yellowing of your teeth and nails?  CALCIUM AND VITAMIN D:  Adequate intake of  calcium and Vitamin D are recommended.  The recommendations for exact amounts of these supplements seem to change often, but generally speaking 600 mg of calcium (either carbonate or citrate) and 800 units of Vitamin D per day seems prudent. Certain women may benefit from higher intake of Vitamin D.  If you are among these women, your doctor will have told you during your visit.    PAP SMEARS:  Pap smears, to check for cervical cancer or precancers,  have traditionally been done yearly, although recent scientific advances have shown that most women can have pap smears less often.  However, every woman still should have a physical exam from her gynecologist every year. It will include a breast check, inspection of the vulva and vagina to check for abnormal growths or skin changes, a visual exam of the cervix, and then an exam to evaluate the size and shape of the uterus and ovaries.  And after 55 years of age, a rectal exam is indicated to check for rectal cancers. We will also provide age appropriate advice regarding health maintenance, like when you should have certain vaccines, screening for sexually transmitted diseases, bone density testing, colonoscopy, mammograms, etc.   MAMMOGRAMS:  All women over 7 years old should have a yearly mammogram. Many facilities now offer a "3D" mammogram, which may cost around $50 extra out of pocket. If possible,  we recommend you accept the option to have the 3D mammogram performed.  It both reduces the number of women who will be called back for extra views which then turn out to be normal, and it is better than the routine mammogram at detecting truly abnormal areas.    COLONOSCOPY:  Colonoscopy to screen for colon cancer is recommended for all women at age 94.  We know, you hate the idea of the prep.  We agree, BUT, having colon cancer and not knowing it is worse!!  Colon cancer so often starts as a polyp that can be seen and removed at colonscopy, which can quite  literally save your life!  And if your first colonoscopy is normal and you have no family history of colon cancer, most women don't have to have it again for 10 years.  Once every ten years, you can do something that may end up saving your life, right?  We will be happy to help you get it scheduled when you are ready.  Be sure to check your insurance coverage so you understand how much it will cost.  It may be covered as a preventative service at no cost, but you should check your particular policy.

## 2012-11-23 NOTE — Progress Notes (Signed)
Patient ID: [name] [name], female   DOB: Jul 17, 1955, 57 y.o.   MRN: [id number] GYNECOLOGY VISIT  PCP:   Theressa Millard, MD  Referring provider:   HPI: 57 y.o.   Married  Caucasian  female   [phone] with No LMP recorded. Patient is postmenopausal.   here for  AEX.  Occasional hot flashes. Doing weight watchers.  Some vaginal dryness.which is manageable.   Hgb:  PCP Urine:  Neg  GYNECOLOGIC HISTORY: No LMP recorded. Patient is postmenopausal. Sexually active:  Yes Partner preference: female Contraception:   Postmenopausal Menopausal hormone therapy:  Premature menopause age 76  Stopped HRT age 89.   DES exposure:   no Blood transfusions:   no Sexually transmitted diseases: no GYN Procedures:  no Mammogram:    11-17-12--pt. Negative      Pap:  11-16-11 wnl, high risk HPV negative History of abnormal pap smear:  no   OB History   Grav Para Term Preterm Abortions TAB SAB Ect Mult Living   3 2 2  1  1   2        LIFESTYLE: Exercise:    Cardio, cycling         Tobacco:     no Alcohol:        no Drug use:     no  OTHER HEALTH MAINTENANCE: Tetanus/TDap:  2004 Gardisil:  NA Influenza:  11/2011 Zostavax:  NA  Bone density:  never Colonoscopy:  2009 wnl:Dr. Elenore Paddy. Next colonoscopy due 2019  Cholesterol check: 05/2012 wnl  Family History  Problem Relation Age of Onset  . Cancer Maternal Aunt     breast    Patient Active Problem List   Diagnosis Date Noted  . Pruritus ani 02/12/2011  . External hemorrhoids with complication 02/12/2011  . UNSPECIFIED TACHYCARDIA 11/19/2008  . PALPITATIONS 11/19/2008  . WRIST PAIN 12/19/2007  . HIP PAIN 12/19/2007  . SINUSITIS, ACUTE 06/16/2007  . HEADACHE 06/16/2007  . HYPERTENSION 12/27/2006  . ALLERGIC RHINITIS 12/27/2006  . ASTHMA 12/27/2006  . NEUROPATHY, OTHER INFLAMMATORY AND TOXIC 09/22/2006   Past Medical History  Diagnosis Date  . Hypertension   . Asthma   . Pruritus ani   . PVC (premature ventricular contraction)    . Spinal stenosis     Past Surgical History  Procedure Laterality Date  . Breast enhancement surgery  1999  . Tubal ligation Bilateral 1993  . Dilation and curettage of uterus  1992    missed AB    ALLERGIES: Ace inhibitors; Cefuroxime axetil; Irbesartan; and Sulfonamide derivatives  Current Outpatient Prescriptions  Medication Sig Dispense Refill  . amLODipine (NORVASC) 5 MG tablet daily.      . calcium carbonate (OS-CAL) 600 MG TABS Take 600 mg by mouth daily.        . cetirizine (ZYRTEC) 10 MG tablet Take 10 mg by mouth daily.        . Multiple Vitamin (MULTIVITAMIN) capsule Take 1 capsule by mouth daily.        Marland Kitchen PROAIR HFA 108 (90 BASE) MCG/ACT inhaler Ad lib.      . SYMBICORT 160-4.5 MCG/ACT inhaler daily.       No current facility-administered medications for this visit.     ROS:  Pertinent items are noted in HPI.  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  2 daughters.  One grandson - 68 months old.   PHYSICAL EXAMINATION:    BP 136/80  Pulse 70  Ht 5\' 1"  (1.549 m)  Wt 153 lb (69.4 kg)  BMI 28.92 kg/m2   Wt Readings from Last 3 Encounters:  11/23/12 153 lb (69.4 kg)  03/05/11 152 lb 3.2 oz (69.037 kg)  02/12/11 151 lb 9.6 oz (68.765 kg)     Ht Readings from Last 3 Encounters:  11/23/12 5\' 1"  (1.549 m)  03/05/11 5\' 1"  (1.549 m)  02/12/11 5\' 1"  (1.549 m)    General appearance: alert, cooperative and appears stated age Head: Normocephalic, without obvious abnormality, atraumatic Neck: no adenopathy, supple, symmetrical, trachea midline and thyroid not enlarged, symmetric, no tenderness/mass/nodules Lungs: clear to auscultation bilaterally Breasts: Inspection negative, No nipple retraction or dimpling, No nipple discharge or bleeding, No axillary or supraclavicular adenopathy, Normal to palpation without dominant masses Heart: regular rate and rhythm.  Occasional added beat.  Abdomen: soft, non-tender; no masses,  no organomegaly Extremities:  extremities normal, atraumatic, no cyanosis or edema Skin: Skin color, texture, turgor normal. No rashes or lesions Lymph nodes: Cervical, supraclavicular, and axillary nodes normal. No abnormal inguinal nodes palpated Neurologic: Grossly normal  Pelvic: External genitalia:  no lesions              Urethra:  normal appearing urethra with no masses, tenderness or lesions              Bartholins and Skenes: normal                 Vagina: normal appearing vagina with normal color and discharge, no lesions              Cervix: normal appearance              Pap and high risk HPV testing done: no.            Bimanual Exam:  Uterus:  uterus is normal size, shape, consistency and nontender                                      Adnexa: normal adnexa in size, nontender and no masses                                      Rectovaginal: Confirms                                      Anus:  normal sphincter tone, no lesions  ASSESSMENT  Normal gynecologic exam.  PLAN  Mammogram yearly Pap smear and high risk HPV testing in 4 years. TDap today. Return annually or prn   An After Visit Summary was printed and given to the patient.

## 2013-07-25 ENCOUNTER — Other Ambulatory Visit: Payer: Self-pay | Admitting: Obstetrics and Gynecology

## 2013-07-25 DIAGNOSIS — Z1231 Encounter for screening mammogram for malignant neoplasm of breast: Secondary | ICD-10-CM

## 2013-09-11 ENCOUNTER — Telehealth: Payer: Self-pay | Admitting: Obstetrics and Gynecology

## 2013-09-11 DIAGNOSIS — N950 Postmenopausal bleeding: Secondary | ICD-10-CM

## 2013-09-11 NOTE — Telephone Encounter (Signed)
Spoke with patient at time of incoming call. She states she has not had a cycle for over ten years and had menopause at age of 58. Prior patient of Dr. Tresa Resomine. Not currently on HRT. Last annual exam 10/2012 with Dr. Edward JollySilva.  She states that two weeks ago she developed some dark brown vaginal spotting that last for one day and then resolved. Since then, she has had intermittent lower pelvic pain "it feels just like menstrual period cramps." States that she takes Advil prn for these cramps but that it does not help cramps. Declines office visit until Thursday, patient requests Thursday appointment as she has prior engagements with family in town. No current bleeding, no fevers, no abdominal pain or dysuria.   Advised that Dr. Edward JollySilva is out of office this week and would call her back with an appointment for evaluation. She is agreeable to this.

## 2013-09-11 NOTE — Telephone Encounter (Signed)
Order for endometrial bx placed for precert per triage protocol.   Spoke with patient and advised for appointment with Dr. Hyacinth MeekerMiller for 09/14/13 at 1045 (time okay with Dr. Hyacinth MeekerMiller per Billie RuddySally Yeakley, RN).  Advised patient that she may have endometrial biopsy completed at time of office visit with one episode of post menopausal bleeding. Patient is agreeable to this. Advised to take 800 mg Motrin PO one hour before appointment with fluids and food. She is agreeable to this.  Routing to [place] as FYI order was placed, does not need to be contacted with pre cert information.  Routing to provider for final review. Patient agreeable to disposition. Will close encounter

## 2013-09-11 NOTE — Telephone Encounter (Signed)
Pt has not had a cycle for over 10 years and last week she started spotting. She has been having really bad cramps and is concerned.

## 2013-09-12 NOTE — Telephone Encounter (Signed)
Spoke with patient. Advised that per benefit quote received, she will be responsible for $51.68 when she comes in for EMB on Thursday. Patient agreeable.

## 2013-09-14 ENCOUNTER — Ambulatory Visit (INDEPENDENT_AMBULATORY_CARE_PROVIDER_SITE_OTHER): Payer: BC Managed Care – PPO | Admitting: Obstetrics & Gynecology

## 2013-09-14 ENCOUNTER — Ambulatory Visit (INDEPENDENT_AMBULATORY_CARE_PROVIDER_SITE_OTHER): Payer: BC Managed Care – PPO

## 2013-09-14 ENCOUNTER — Encounter: Payer: Self-pay | Admitting: Obstetrics & Gynecology

## 2013-09-14 VITALS — BP 134/84 | HR 60 | Ht 61.0 in | Wt 139.0 lb

## 2013-09-14 DIAGNOSIS — N949 Unspecified condition associated with female genital organs and menstrual cycle: Secondary | ICD-10-CM

## 2013-09-14 DIAGNOSIS — N950 Postmenopausal bleeding: Secondary | ICD-10-CM

## 2013-09-14 DIAGNOSIS — Z124 Encounter for screening for malignant neoplasm of cervix: Secondary | ICD-10-CM

## 2013-09-14 DIAGNOSIS — R102 Pelvic and perineal pain: Secondary | ICD-10-CM

## 2013-09-14 NOTE — Progress Notes (Addendum)
Subjective:     Patient ID: [name] [name], female   DOB: [birth year], 58 y.o.   MRN: [id number]  HPI 58 yo G3P2 MWF here for episode of PMP bleeding that occurred the last week of June.  Since then, she has had lower abdominal cramping.  No discharge.  No fevers.  Having regular bowel movements.  No urinary symptoms.    Upon considering the changes in the last couple of weeks for her, she reports she stopped her Mobic (that she has been on for months) right about the same time this started.    Review of Systems  All other systems reviewed and are negative.      Objective:   Physical Exam  Constitutional: She appears well-developed and well-nourished.  Abdominal: Soft. Bowel sounds are normal. She exhibits no mass. There is no rebound and no guarding.  Genitourinary: Vagina normal and uterus normal. There is no rash, tenderness or lesion on the right labia. There is no rash, tenderness or lesion on the left labia. Right adnexum displays no mass and no tenderness. Left adnexum displays no mass and no tenderness. No bleeding around the vagina. No vaginal discharge found.  Lymphadenopathy:       Right: No inguinal adenopathy present.       Left: No inguinal adenopathy present.   Ultrasound performed.  Uterus normal.  Thin endometrium.  Ovaries postmenopausal and atrophic in appearance.    Endometrial biopsy recommended.  Discussed with patient.  Verbal and written consent obtained.   Procedure:  Speculum placed.  Cervix visualized and cleansed with betadine prep.  A single toothed tenaculum was applied to the anterior lip of the cervix.  Endometrial pipelle was advanced through the cervix into the endometrial cavity without difficulty.  Pipelle passed to 6 cm.  Suction applied and pipelle removed with good tissue sample obtained.  Tenculum removed.  No bleeding noted.  Patient tolerated procedure well.     Assessment:     PMP bleeding Uterine cramping     Plan:     Biopsy pending.  Pt  will be called with results but I am doubtful of anything abnormal.  Call work number first.   Pt will restart Mobic.  She will call if still having cramping in 2 weeks. AEX scheduled for Jan, 2016.

## 2013-09-15 ENCOUNTER — Telehealth: Payer: Self-pay

## 2013-09-15 NOTE — Telephone Encounter (Signed)
Lmtcb//kn 

## 2013-09-15 NOTE — Addendum Note (Signed)
Addended by: Jerene BearsMILLER, Maribel Luis S on: 09/15/2013 10:30 AM   Modules accepted: Orders

## 2013-09-15 NOTE — Telephone Encounter (Signed)
Message copied by Elisha HeadlandNIX, Onnie Hatchel S on Fri Sep 15, 2013  2:10 PM ------      Message from: Jerene BearsMILLER, MARY S      Created: Fri Sep 15, 2013  1:32 PM       Inform endometrial biopsy is negative.  She is going to restart her mobic and let me know after 1-2 weeks of being on it if her pain improves. ------

## 2013-09-15 NOTE — Telephone Encounter (Signed)
Patient notified of results-see result note.//kn 

## 2013-09-18 ENCOUNTER — Telehealth: Payer: Self-pay | Admitting: *Deleted

## 2013-09-18 NOTE — Telephone Encounter (Signed)
Call to patient for update on pain. LMTCB.

## 2013-09-18 NOTE — Telephone Encounter (Signed)
Message copied by Alisa GraffYEAKLEY, Endiya Klahr on Mon Sep 18, 2013  3:39 PM ------      Message from: Jerene BearsMILLER, MARY S      Created: Fri Sep 15, 2013  5:44 PM       If she is still in pain Monday, schedule CT of abdomen and pelvis with and without contrast. ------

## 2013-09-19 LAB — IPS PAP TEST WITH REFLEX TO HPV

## 2013-09-19 NOTE — Telephone Encounter (Signed)
Patient retruning call. States she restarted Mobic Friday, Saturday and Sunday and by late Sunday cramping had improved. She is not sure if this is the reason why bit she is feeling better and will continue Mobic for now. She will call back to give update next week when dr Hyacinth MeekerMiller returns to office.  Routing to provider for final review. Patient agreeable to disposition. Will close encounter  To Dr Edward JollySilva while Dr Hyacinth MeekerMiller out of office.

## 2013-09-20 ENCOUNTER — Telehealth: Payer: Self-pay

## 2013-09-20 ENCOUNTER — Other Ambulatory Visit: Payer: Self-pay | Admitting: Obstetrics and Gynecology

## 2013-09-20 DIAGNOSIS — R102 Pelvic and perineal pain: Secondary | ICD-10-CM

## 2013-09-20 DIAGNOSIS — N950 Postmenopausal bleeding: Secondary | ICD-10-CM

## 2013-09-20 NOTE — Telephone Encounter (Signed)
Call to patient and notified of [facility] at 2pm. Advised needs to have screening lab work. Patient is unable to come here for this today.Advised that she can [facility] but I believe they can do the labs there on site but may have to go early. Phone number given ([phone]) to call for instructions on labs and arrival times. Advised of normal pap smear results.   [name] [name], any precert needed? French Anaracy, please add to results "hold".  Called patient back approximately 10 minutes later to confirm she was able to get instructions for lab draw. [facility] told her no blood tests were needed. Do you want her to still have these?

## 2013-09-20 NOTE — Telephone Encounter (Signed)
Call to [facility], [place], CT tomorrow 2pm. Will be given instructions when picks up contrast.  Is this ok?

## 2013-09-20 NOTE — Telephone Encounter (Signed)
Patient notified labs only if needed for radiology.  Encounter closed.

## 2013-09-20 NOTE — Telephone Encounter (Signed)
Left message to call Kaitlyn at [phone]. 

## 2013-09-20 NOTE — Telephone Encounter (Signed)
Patient called into office to me directly, [facility] from [facility]. See phone messages from yesterday, follow up from cramping and pain. Today called back to say that cramping has restarted and is uncomfortable and she thinks she would like to proceed with CT scan. Unavailable this afternoon or Friday. Would prefer tomorrow if available. Husband can pick up contrast. Will review with Dr Edward JollySilva, dr Hyacinth MeekerMiller is out of office.  Ok to proceed with scheduling CT?

## 2013-09-20 NOTE — Telephone Encounter (Signed)
No blood work at this time as long as radiology does not need them.  Thanks!

## 2013-09-20 NOTE — Telephone Encounter (Signed)
Ok to proceed with CT scan with contrast. Will need to have BUN, Cr drawn today if this was not already done.

## 2013-09-20 NOTE — Telephone Encounter (Signed)
Message copied by Jannet AskewHINES, Mahreen Schewe E on Wed Sep 20, 2013 10:39 AM ------      Message from: [facility] Gwenevere GhaziARVALHO E SILVA, BROOK E      Created: Tue Sep 19, 2013  5:17 PM       Please let patient know of her normal pap.      Patient was seen by Dr. Hyacinth MeekerMiller in my absence for postmenopausal bleeding and had a negative EMB.      I am sure that she will appreciate the call instead of waiting for a written report.             Please enter recall - 02. ------

## 2013-09-20 NOTE — Telephone Encounter (Addendum)
Imaging hold placed.  No pre authorization needed.

## 2013-09-21 ENCOUNTER — Ambulatory Visit
Admission: RE | Admit: 2013-09-21 | Discharge: 2013-09-21 | Disposition: A | Discharge status: Home / Self Care | Payer: BC Managed Care – PPO | Source: Ambulatory Visit | Attending: Obstetrics and Gynecology | Admitting: Obstetrics and Gynecology

## 2013-09-21 DIAGNOSIS — N950 Postmenopausal bleeding: Secondary | ICD-10-CM

## 2013-09-21 DIAGNOSIS — R102 Pelvic and perineal pain: Secondary | ICD-10-CM

## 2013-09-21 MED ORDER — IOHEXOL 300 MG/ML  SOLN
100.0000 mL | Freq: Once | INTRAMUSCULAR | Status: AC | PRN
  Administered 2013-09-21: 100 mL via INTRAVENOUS

## 2013-09-22 ENCOUNTER — Telehealth: Payer: Self-pay | Admitting: Obstetrics and Gynecology

## 2013-09-22 ENCOUNTER — Telehealth: Payer: Self-pay | Admitting: Obstetrics & Gynecology

## 2013-09-22 DIAGNOSIS — Q613 Polycystic kidney, unspecified: Secondary | ICD-10-CM

## 2013-09-22 NOTE — Telephone Encounter (Signed)
I left a message on the patient's cell phone to return a call to my cell phone as I am out of the office today. No details left. See phone message.  As we discussed, the patient will need referral to urology or nephrology and may need genetic testing for autosomal dominant polycystic kidneys.

## 2013-09-22 NOTE — Telephone Encounter (Signed)
Dr Edward JollySilva, CT report visible in EPIC.  Please advise/

## 2013-09-22 NOTE — Telephone Encounter (Signed)
Call to [facility], spoke with assistant Katie for Dr Laverle PatterBorden. He is happy to see patient but refereal to [facility] center [facility] is also reasonable. Recommends whatever patient prefers.

## 2013-09-22 NOTE — Telephone Encounter (Signed)
Patient wanted to let sally know that she had CT done yesterday and reports will be sent to silva. Said if we get them to call her work number

## 2013-09-22 NOTE — Telephone Encounter (Signed)
Attempt to reach patient at work.  I left a message in her answer box to return my call on my cell phone.

## 2013-09-22 NOTE — Telephone Encounter (Signed)
Phone call to patient to discuss results of CT scan. No details left.  I asked the patient to return my call to my cell phone. (I am out of the office today.)  CT scan showing polycystic kidneys, nephrolithiasis, and hemangiomas of the liver.  Will need referral to either urology or nephrology.  Office is currently researching which option is best. May need genetic testing for autosomal dominant polycystic kidneys.   Will need to stop her Mobic.

## 2013-09-23 NOTE — Telephone Encounter (Signed)
Late entry.   Phone conversation on 09/22/13 in afternoon around 1:15 pm.  Patient given report from CT scan: polycystic kidneys with potential hemorrhagic cysts, nephrolithiasis, and hemangiomas of the liver.  States she is aware that she has polycystic kidneys, but she has never had any specific evaluation for autosomal dominant polycystic kidneys.   Patient reports a history of an significantly elevated ANA of undetermined etiology? And [facility].  Denies any further postmenopausal bleeding.   I stated that I am researching where she can receive evaluation for the potential autosomal dominant polycystic kidneys - [facility], nephrology, or university setting.  She accepts any of these options.   I recommended stopping the Mobic, which she has already done.

## 2013-09-23 NOTE — Telephone Encounter (Signed)
I am OK with patient choosing. Patient is already [facility] due to elevated ANA levels.

## 2013-09-25 NOTE — Telephone Encounter (Signed)
Patient retruned call, notified of appointment schedule with [facility] and dr Laverle PatterBorden on 11-01-13 1245. Advised of process of waiting to see if can move appointment up. Also advised that referral info faxed [facility] to see if they can see her sooner and patient can then decide. Patient agreeable.   Routing to provider for final review. Patient agreeable to disposition. Will close encounter

## 2013-09-25 NOTE — Telephone Encounter (Signed)
LMTCB. Since first available appt in [place] is not until 11-01-13, referral info faxed to [facility] to see what their first available will be and then patient can decide.

## 2013-09-25 NOTE — Telephone Encounter (Signed)
Patient calling to check on status of referral. Please see update from Dr. Edward JollySilva below re: [name] [name]. Patient wants to be sure CT scan is included in records sent for referral.

## 2013-09-25 NOTE — Telephone Encounter (Signed)
Return call to patient.Advised of info from dr Laverle PatterBorden and Dr Edward JollySilva. patietn has only been to [facility] for [facility] issues , not for urology. She agrees to starting with urology referral locally to get started and see extent of needed  Management. After seeing urologist, will determine if needs [facility].  Will schedule with Dr Laverle PatterBorden and call her back.  Call to Dr Vevelyn RoyalsBorden's office, spoke to PembinaPatrice at front desk. First available appt 11-01-13. Transferred to triage, Asher MuirJamie. Dr Laverle PatterBorden out of office this week, she will message him and will see about getting earlier appointment.

## 2013-09-26 ENCOUNTER — Telehealth: Payer: Self-pay | Admitting: Obstetrics & Gynecology

## 2013-09-26 NOTE — Telephone Encounter (Signed)
Call fromJamie. States she spoke to Dr borden who reviewed notes in EPIC and since patient does not have any acute issues, he is comfortable with appt given for 11-01-13. Explained to Asher MuirJamie that the patient is experiencing pain and that was the reason we were doing evaluation and trying to move appointment up. jamie states they can move her appointment up if she sees another provider. Advised I would need to review this with Dr Edward JollySilva, keep appoitnemtn as scheduled for now. Also have [facility] so if they can see her sooner will let patient decide.

## 2013-09-26 NOTE — Telephone Encounter (Signed)
Patient says she is returning a call to GriffinSally. No telephone note.

## 2013-09-26 NOTE — Telephone Encounter (Signed)
Return call to patient. She states she has [facility] and they can see her on 10-16-13. She is weighing option of care locally versus [facility]. Advised this is really patient preference. Advised I spoke with [facility] and currently cannot get earlier appointment. Discussed that we can leave both appointments as scheduled, will review with Dr Edward JollySilva in am. Could also go to both and then after getting info from both, make decision on how she desires to continue care. Would like any input from dr Edward JollySilva, advised this will be reviewed by Dr Edward JollySilva tomorrow and can call her back. Agreeable.

## 2013-09-27 NOTE — Telephone Encounter (Signed)
Call to patient, notified of Dr Rica RecordsSilva's recommendation. She is requesting to have CT scan to both locations. Advised this has already been done.  Encounter closed.

## 2013-09-27 NOTE — Telephone Encounter (Signed)
I would keep both appointments. [facility] and then she will decide if she wants to continue with the local appointment or establish follow up care with someone locally.

## 2013-11-07 ENCOUNTER — Encounter: Payer: Self-pay | Admitting: Internal Medicine

## 2013-11-21 ENCOUNTER — Ambulatory Visit (HOSPITAL_COMMUNITY): Payer: BC Managed Care – PPO

## 2013-11-27 ENCOUNTER — Ambulatory Visit: Payer: BC Managed Care – PPO | Admitting: Obstetrics and Gynecology

## 2013-11-29 ENCOUNTER — Ambulatory Visit (HOSPITAL_COMMUNITY)
Admission: RE | Admit: 2013-11-29 | Discharge: 2013-11-29 | Disposition: A | Discharge status: Home / Self Care | Payer: BC Managed Care – PPO | Source: Ambulatory Visit | Attending: Obstetrics and Gynecology | Admitting: Obstetrics and Gynecology

## 2013-11-29 ENCOUNTER — Ambulatory Visit: Payer: BC Managed Care – PPO | Admitting: Obstetrics and Gynecology

## 2013-11-29 DIAGNOSIS — Z1231 Encounter for screening mammogram for malignant neoplasm of breast: Secondary | ICD-10-CM | POA: Insufficient documentation

## 2014-01-01 ENCOUNTER — Encounter: Payer: Self-pay | Admitting: Obstetrics & Gynecology

## 2014-02-06 ENCOUNTER — Encounter: Payer: Self-pay | Admitting: Obstetrics & Gynecology

## 2014-03-08 ENCOUNTER — Ambulatory Visit: Payer: BC Managed Care – PPO | Admitting: Obstetrics & Gynecology

## 2014-06-26 ENCOUNTER — Ambulatory Visit (INDEPENDENT_AMBULATORY_CARE_PROVIDER_SITE_OTHER): Payer: BLUE CROSS/BLUE SHIELD | Admitting: Obstetrics & Gynecology

## 2014-06-26 ENCOUNTER — Encounter: Payer: Self-pay | Admitting: Obstetrics & Gynecology

## 2014-06-26 VITALS — BP 126/82 | HR 80 | Resp 12 | Ht 61.0 in | Wt 146.6 lb

## 2014-06-26 DIAGNOSIS — Z01419 Encounter for gynecological examination (general) (routine) without abnormal findings: Secondary | ICD-10-CM

## 2014-06-26 DIAGNOSIS — Q612 Polycystic kidney, adult type: Secondary | ICD-10-CM | POA: Diagnosis not present

## 2014-06-26 DIAGNOSIS — Z0000 Encounter for general adult medical examination without abnormal findings: Secondary | ICD-10-CM

## 2014-06-26 DIAGNOSIS — M25531 Pain in right wrist: Secondary | ICD-10-CM

## 2014-06-26 LAB — POCT URINALYSIS DIPSTICK
Bilirubin, UA: NEGATIVE
Blood, UA: NEGATIVE
Glucose, UA: NEGATIVE
Ketones, UA: NEGATIVE
Leukocytes, UA: NEGATIVE
Nitrite, UA: NEGATIVE
Protein, UA: NEGATIVE
Urobilinogen, UA: NEGATIVE
pH, UA: 5

## 2014-06-26 NOTE — Progress Notes (Signed)
59 y.o. [id number] MarriedCaucasianF here for annual exam.  Doing well.  No vaginal bleeding since July of 2015.  Pt had PUS, endo bx, and Pap.  All of this was negative.    Dr. [facility]adonato at [facility] has seen pt.  She will be seen yearly.  D/w pt genetic testing/ultrasound for diagnosis.  Pt reports having lupus panel that was negative.  He did it due to an elevated ANA.  No LMP recorded. Patient is postmenopausal.          Sexually active: Yes.    The current method of family planning is post menopausal status.   Exercising: Yes.    exercise bike and walking Smoker:  no  Health Maintenance: Pap:  09/14/13 WNL History of abnormal Pap:  no MMG:  11/29/13-normal Colonoscopy:  2009-repeat in 10 years BMD:   none TDaP:  11/23/12 Screening Labs: PCP, Hb today: PCP, Urine today: negative   reports that she has never smoked. She has never used smokeless tobacco. She reports that she does not drink alcohol or use illicit drugs.  Past Medical History  Diagnosis Date  . Hypertension   . Asthma   . Pruritus ani   . PVC (premature ventricular contraction)   . Spinal stenosis   . Polycystic kidney disease     followed at [facility], stage II PKD  . Renal stone     Past Surgical History  Procedure Laterality Date  . Breast enhancement surgery  1999  . Tubal ligation Bilateral 1993  . Dilation and curettage of uterus  1992    missed AB  . Augmentation mammaplasty     Family History  Problem Relation Age of Onset  . Cancer Maternal Aunt     breast  . Hypertension Mother   . Hyperlipidemia Mother   . Stroke Mother   . Hypertension Father     ROS:  Pertinent items are noted in HPI.  Otherwise, a comprehensive ROS was negative.  Exam:   BP 126/82 mmHg  Pulse 80  Resp 12  Ht 5\' 1"  (1.549 m)  Wt 146 lb 9.6 oz (66.497 kg)  BMI 27.71 kg/m2  LMP   Height: 5\' 1"  (154.9 cm)  Ht Readings from Last 3 Encounters:  06/26/14 5\' 1"  (1.549 m)  09/14/13 5\' 1"  (1.549 m)  11/23/12  5\' 1"  (1.549 m)    General appearance: alert, cooperative and appears stated age Head: Normocephalic, without obvious abnormality, atraumatic Neck: no adenopathy, supple, symmetrical, trachea midline and thyroid normal to inspection and palpation Lungs: clear to auscultation bilaterally Breasts: normal appearance, no masses or tenderness Heart: regular rate and rhythm Abdomen: soft, non-tender; bowel sounds normal; no masses,  no organomegaly Extremities: extremities normal, atraumatic, no cyanosis or edema Skin: Skin color, texture, turgor normal. No rashes or lesions Lymph nodes: Cervical, supraclavicular, and axillary nodes normal. No abnormal inguinal nodes palpated Neurologic: Grossly normal   Pelvic: External genitalia:  no lesions              Urethra:  normal appearing urethra with no masses, tenderness or lesions              Bartholins and Skenes: normal                 Vagina: normal appearing vagina with normal color and discharge, no lesions              Cervix: no lesions  Pap taken: No. Bimanual Exam:  Uterus:  normal size, contour, position, consistency, mobility, non-tender              Adnexa: normal adnexa and no mass, fullness, tenderness               Rectovaginal: Confirms               Anus:  normal sphincter tone, no lesions  Chaperone was present for exam.  A:  Well Woman with normal exam Adult polycystic kidney disease.  Followed by Dr. Arrie Aran yearly. (also liver cysts on CT) Arthritis H/O elevated ANA Hypertension   P:   Mammogram yearly pap smear 7/15 Labs with Dr. Kristie Cowman every six months return annually or prn

## 2014-08-09 ENCOUNTER — Other Ambulatory Visit: Payer: Self-pay

## 2014-08-09 DIAGNOSIS — Z1231 Encounter for screening mammogram for malignant neoplasm of breast: Secondary | ICD-10-CM

## 2014-12-03 ENCOUNTER — Ambulatory Visit
Admission: RE | Admit: 2014-12-03 | Discharge: 2014-12-03 | Disposition: A | Discharge status: Home / Self Care | Payer: BLUE CROSS/BLUE SHIELD | Source: Ambulatory Visit

## 2014-12-03 DIAGNOSIS — Z1231 Encounter for screening mammogram for malignant neoplasm of breast: Secondary | ICD-10-CM

## 2014-12-05 ENCOUNTER — Other Ambulatory Visit: Payer: Self-pay | Admitting: Obstetrics & Gynecology

## 2014-12-05 DIAGNOSIS — R928 Other abnormal and inconclusive findings on diagnostic imaging of breast: Secondary | ICD-10-CM

## 2014-12-20 ENCOUNTER — Ambulatory Visit
Admission: RE | Admit: 2014-12-20 | Discharge: 2014-12-20 | Disposition: A | Discharge status: Home / Self Care | Payer: BLUE CROSS/BLUE SHIELD | Source: Ambulatory Visit | Attending: Obstetrics & Gynecology | Admitting: Obstetrics & Gynecology

## 2014-12-20 DIAGNOSIS — R928 Other abnormal and inconclusive findings on diagnostic imaging of breast: Secondary | ICD-10-CM

## 2015-01-01 ENCOUNTER — Telehealth: Payer: Self-pay | Admitting: Emergency Medicine

## 2015-01-01 NOTE — Telephone Encounter (Signed)
-----   Message from Jerene BearsMary S Miller, MD sent at 01/01/2015 10:48 AM EDT ----- Regarding: RE: Mammogram hold  Out of mmg hold.  MSM ----- Message -----    From: Joeseph Amorracy L Fransisca Shawn, RN    Sent: 12/31/2014   1:56 PM      To: Jerene BearsMary S Miller, MD Subject: Mammogram hold                                 Dr. Hyacinth MeekerMiller,  This patient was a recall from screening.  Okay to remove from mammogram hold?

## 2015-01-01 NOTE — Telephone Encounter (Signed)
Out of hold per Dr. Miller.   

## 2015-06-06 DIAGNOSIS — Z0000 Encounter for general adult medical examination without abnormal findings: Secondary | ICD-10-CM | POA: Diagnosis not present

## 2015-08-31 DIAGNOSIS — H2513 Age-related nuclear cataract, bilateral: Secondary | ICD-10-CM | POA: Diagnosis not present

## 2015-09-10 ENCOUNTER — Ambulatory Visit: Payer: BLUE CROSS/BLUE SHIELD | Admitting: Obstetrics & Gynecology

## 2015-09-17 ENCOUNTER — Ambulatory Visit: Payer: BLUE CROSS/BLUE SHIELD | Admitting: Obstetrics & Gynecology

## 2015-09-23 ENCOUNTER — Other Ambulatory Visit: Payer: Self-pay | Admitting: Obstetrics & Gynecology

## 2015-09-23 DIAGNOSIS — Z9882 Breast implant status: Secondary | ICD-10-CM

## 2015-09-23 DIAGNOSIS — Z1231 Encounter for screening mammogram for malignant neoplasm of breast: Secondary | ICD-10-CM

## 2015-09-24 ENCOUNTER — Encounter: Payer: Self-pay | Admitting: Obstetrics & Gynecology

## 2015-09-24 ENCOUNTER — Ambulatory Visit (INDEPENDENT_AMBULATORY_CARE_PROVIDER_SITE_OTHER): Payer: BLUE CROSS/BLUE SHIELD | Admitting: Obstetrics & Gynecology

## 2015-09-24 VITALS — BP 140/86 | HR 66 | Ht 61.0 in | Wt 142.0 lb

## 2015-09-24 DIAGNOSIS — Z124 Encounter for screening for malignant neoplasm of cervix: Secondary | ICD-10-CM | POA: Diagnosis not present

## 2015-09-24 DIAGNOSIS — Z0000 Encounter for general adult medical examination without abnormal findings: Secondary | ICD-10-CM

## 2015-09-24 DIAGNOSIS — Z01419 Encounter for gynecological examination (general) (routine) without abnormal findings: Secondary | ICD-10-CM

## 2015-09-24 LAB — POCT URINALYSIS DIPSTICK
Bilirubin, UA: NEGATIVE
Blood, UA: NEGATIVE
Glucose, UA: NEGATIVE
Ketones, UA: NEGATIVE
Leukocytes, UA: NEGATIVE
Nitrite, UA: NEGATIVE
Protein, UA: NEGATIVE
Urobilinogen, UA: NEGATIVE
pH, UA: 7

## 2015-09-24 NOTE — Patient Instructions (Signed)
Have a Hep C test done with your next lab work at [facility].

## 2015-09-24 NOTE — Progress Notes (Signed)
60 y.o. [id number] MarriedCaucasianF here for annual exam.  Doing well.  No vaginal bleeding.  Being followed by Dr. Lawana Pai for adult polycystic kidney.  Being seen once yearly and has renal function testing done with him.  This was normal.  Mother passed away in 09/08/22.  Father is staying in the home and he has dementia.  He needs to be in an assisted living facility.  He is 58 yo.  This is a big stressor for family and he just refuses to accept need for additional assistance.   Patient's last menstrual period was 03/03/2007 (approximate).          Sexually active: Yes.    The current method of family planning is post menopausal status.    Exercising: Yes.    Walking Smoker:  no  Health Maintenance: Pap:  09/14/13 Neg History of abnormal Pap:  no MMG:  12/20/14 Diagnostic Bilateral BIRADS1:neg Colonoscopy:  10/28/2007 Normal - f/u 10 years  BMD:   None  TDaP:  11/23/2012  Pneumonia vaccine(s):  Done w/ PCP Zostavax:   No Hep C testing: Unsure Screening Labs: PCP, Urine today: Negative   reports that she has never smoked. She has never used smokeless tobacco. She reports that she does not drink alcohol or use drugs.  Past Medical History:  Diagnosis Date  . Asthma   . Hypertension   . Polycystic kidney disease    followed at [facility], stage II PKD  . Pruritus ani   . PVC (premature ventricular contraction)   . Renal stone   . Spinal stenosis     Past Surgical History:  Procedure Laterality Date  . AUGMENTATION MAMMAPLASTY    . BREAST ENHANCEMENT SURGERY  1999  . DILATION AND CURETTAGE OF UTERUS  1992   missed AB  . TUBAL LIGATION Bilateral 1993    Current Outpatient Prescriptions  Medication Sig Dispense Refill  . amLODipine (NORVASC) 5 MG tablet daily.    . calcium carbonate (OS-CAL) 600 MG TABS Take 600 mg by mouth daily.      . cetirizine (ZYRTEC) 10 MG tablet Take 10 mg by mouth daily.      Marland Kitchen LORazepam (ATIVAN) 0.5 MG tablet Take 0.5 mg by mouth as needed.     . Multiple Vitamin (MULTIVITAMIN) capsule Take 1 capsule by mouth daily.      Marland Kitchen PROAIR HFA 108 (90 BASE) MCG/ACT inhaler as needed.     . SYMBICORT 160-4.5 MCG/ACT inhaler daily.    Marland Kitchen triamcinolone (NASACORT) 55 MCG/ACT AERO nasal inhaler 2 sprays daily as needed. nasal     No current facility-administered medications for this visit.     Family History  Problem Relation Age of Onset  . Hypertension Mother   . Hyperlipidemia Mother   . Stroke Mother   . Hypertension Father   . Cancer Maternal Aunt     breast    ROS:  Pertinent items are noted in HPI.  Otherwise, a comprehensive ROS was negative.  Exam:   BP 140/86 (BP Location: Left Arm, Cuff Size: Normal)   Pulse 66   Ht  (1.549 m)   Wt 142 lb (64.4 kg)   LMP 03/03/2007 (Approximate)   BMI 26.83 kg/m     Height:  (154.9 cm)  Ht Readings from Last 3 Encounters:  09/24/15  (1.549 m)  06/26/14  (1.549 m)  09/14/13  (1.549 m)    General appearance: alert, cooperative and appears stated age Head: Normocephalic,  without obvious abnormality, atraumatic Neck: no adenopathy, supple, symmetrical, trachea midline and thyroid normal to inspection and palpation Lungs: clear to auscultation bilaterally Breasts: normal appearance, no masses or tenderness Heart: regular rate and rhythm Abdomen: soft, non-tender; bowel sounds normal; no masses,  no organomegaly Extremities: extremities normal, atraumatic, no cyanosis or edema Skin: Skin color, texture, turgor normal. No rashes or lesions Lymph nodes: Cervical, supraclavicular, and axillary nodes normal. No abnormal inguinal nodes palpated Neurologic: Grossly normal   Pelvic: External genitalia:  no lesions              Urethra:  normal appearing urethra with no masses, tenderness or lesions              Bartholins and Skenes: normal                 Vagina: normal appearing vagina with normal color and discharge, no lesions              Cervix: no  lesions              Pap taken: Yes.   Bimanual Exam:  Uterus:  normal size, contour, position, consistency, mobility, non-tender              Adnexa: no mass, fullness, tenderness               Rectovaginal: Confirms               Anus:  normal sphincter tone, no lesions  Chaperone was present for exam.  A:  Well Woman with normal exam PMP, no HRT Stressors with aging father Adult PCKD, stage 2.  Seeing Dr. Lawana Pai.  P:   Mammogram guidelines discussed.  Pt already has this scheduled in October pap smear with HR HPV obtained today D/W pt having Hep C testing done with PCP in September when she sees her new PCP.  Reminder placed with AVS. return annually or prn

## 2015-09-26 LAB — IPS PAP TEST WITH HPV

## 2015-11-12 DIAGNOSIS — L814 Other melanin hyperpigmentation: Secondary | ICD-10-CM | POA: Diagnosis not present

## 2015-11-12 DIAGNOSIS — D1801 Hemangioma of skin and subcutaneous tissue: Secondary | ICD-10-CM | POA: Diagnosis not present

## 2015-11-12 DIAGNOSIS — L821 Other seborrheic keratosis: Secondary | ICD-10-CM | POA: Diagnosis not present

## 2015-11-12 DIAGNOSIS — D235 Other benign neoplasm of skin of trunk: Secondary | ICD-10-CM | POA: Diagnosis not present

## 2015-12-09 DIAGNOSIS — M79674 Pain in right toe(s): Secondary | ICD-10-CM | POA: Diagnosis not present

## 2015-12-24 DIAGNOSIS — I10 Essential (primary) hypertension: Secondary | ICD-10-CM | POA: Diagnosis not present

## 2015-12-24 DIAGNOSIS — Q612 Polycystic kidney, adult type: Secondary | ICD-10-CM | POA: Diagnosis not present

## 2015-12-24 DIAGNOSIS — F411 Generalized anxiety disorder: Secondary | ICD-10-CM | POA: Diagnosis not present

## 2015-12-24 DIAGNOSIS — Z23 Encounter for immunization: Secondary | ICD-10-CM | POA: Diagnosis not present

## 2015-12-24 DIAGNOSIS — Z87442 Personal history of urinary calculi: Secondary | ICD-10-CM | POA: Diagnosis not present

## 2015-12-24 DIAGNOSIS — Q613 Polycystic kidney, unspecified: Secondary | ICD-10-CM | POA: Diagnosis not present

## 2015-12-24 DIAGNOSIS — N182 Chronic kidney disease, stage 2 (mild): Secondary | ICD-10-CM | POA: Diagnosis not present

## 2015-12-24 DIAGNOSIS — J4520 Mild intermittent asthma, uncomplicated: Secondary | ICD-10-CM | POA: Diagnosis not present

## 2015-12-25 ENCOUNTER — Other Ambulatory Visit: Payer: Self-pay | Admitting: Nephrology

## 2015-12-30 ENCOUNTER — Ambulatory Visit
Admission: RE | Admit: 2015-12-30 | Discharge: 2015-12-30 | Disposition: A | Discharge status: Home / Self Care | Payer: BLUE CROSS/BLUE SHIELD | Source: Ambulatory Visit | Attending: Obstetrics & Gynecology | Admitting: Obstetrics & Gynecology

## 2015-12-30 DIAGNOSIS — Z1231 Encounter for screening mammogram for malignant neoplasm of breast: Secondary | ICD-10-CM | POA: Diagnosis not present

## 2015-12-30 DIAGNOSIS — Z9882 Breast implant status: Secondary | ICD-10-CM

## 2016-01-01 ENCOUNTER — Other Ambulatory Visit: Payer: Self-pay | Admitting: Nephrology

## 2016-01-01 DIAGNOSIS — Q612 Polycystic kidney, adult type: Secondary | ICD-10-CM

## 2016-01-09 DIAGNOSIS — J4520 Mild intermittent asthma, uncomplicated: Secondary | ICD-10-CM | POA: Diagnosis not present

## 2016-01-14 DIAGNOSIS — M8588 Other specified disorders of bone density and structure, other site: Secondary | ICD-10-CM | POA: Diagnosis not present

## 2016-01-14 DIAGNOSIS — Z780 Asymptomatic menopausal state: Secondary | ICD-10-CM | POA: Diagnosis not present

## 2016-01-16 ENCOUNTER — Ambulatory Visit
Admission: RE | Admit: 2016-01-16 | Discharge: 2016-01-16 | Disposition: A | Discharge status: Home / Self Care | Payer: BLUE CROSS/BLUE SHIELD | Source: Ambulatory Visit | Attending: Nephrology | Admitting: Nephrology

## 2016-01-16 DIAGNOSIS — Q612 Polycystic kidney, adult type: Secondary | ICD-10-CM | POA: Diagnosis not present

## 2016-01-16 MED ORDER — GADOBENATE DIMEGLUMINE 529 MG/ML IV SOLN
13.0000 mL | Freq: Once | INTRAVENOUS | Status: AC | PRN
  Administered 2016-01-16: 13 mL via INTRAVENOUS

## 2016-01-30 DIAGNOSIS — J4520 Mild intermittent asthma, uncomplicated: Secondary | ICD-10-CM | POA: Diagnosis not present

## 2016-01-30 DIAGNOSIS — B9789 Other viral agents as the cause of diseases classified elsewhere: Secondary | ICD-10-CM | POA: Diagnosis not present

## 2016-01-30 DIAGNOSIS — J069 Acute upper respiratory infection, unspecified: Secondary | ICD-10-CM | POA: Diagnosis not present

## 2016-02-20 DIAGNOSIS — J0110 Acute frontal sinusitis, unspecified: Secondary | ICD-10-CM | POA: Diagnosis not present

## 2016-06-19 DIAGNOSIS — I10 Essential (primary) hypertension: Secondary | ICD-10-CM | POA: Diagnosis not present

## 2016-06-19 DIAGNOSIS — J4520 Mild intermittent asthma, uncomplicated: Secondary | ICD-10-CM | POA: Diagnosis not present

## 2016-06-19 DIAGNOSIS — G6189 Other inflammatory polyneuropathies: Secondary | ICD-10-CM | POA: Diagnosis not present

## 2016-06-19 DIAGNOSIS — M1990 Unspecified osteoarthritis, unspecified site: Secondary | ICD-10-CM | POA: Diagnosis not present

## 2016-08-12 DIAGNOSIS — M25572 Pain in left ankle and joints of left foot: Secondary | ICD-10-CM | POA: Diagnosis not present

## 2016-08-17 DIAGNOSIS — M25572 Pain in left ankle and joints of left foot: Secondary | ICD-10-CM | POA: Diagnosis not present

## 2016-08-18 DIAGNOSIS — M4187 Other forms of scoliosis, lumbosacral region: Secondary | ICD-10-CM | POA: Diagnosis not present

## 2016-08-18 DIAGNOSIS — M545 Low back pain: Secondary | ICD-10-CM | POA: Diagnosis not present

## 2016-08-25 DIAGNOSIS — M4187 Other forms of scoliosis, lumbosacral region: Secondary | ICD-10-CM | POA: Diagnosis not present

## 2016-08-25 DIAGNOSIS — M545 Low back pain: Secondary | ICD-10-CM | POA: Diagnosis not present

## 2016-10-09 ENCOUNTER — Other Ambulatory Visit: Payer: Self-pay | Admitting: Obstetrics & Gynecology

## 2016-10-09 DIAGNOSIS — Z1231 Encounter for screening mammogram for malignant neoplasm of breast: Secondary | ICD-10-CM

## 2016-11-24 DIAGNOSIS — R002 Palpitations: Secondary | ICD-10-CM | POA: Diagnosis not present

## 2016-11-24 DIAGNOSIS — I10 Essential (primary) hypertension: Secondary | ICD-10-CM | POA: Diagnosis not present

## 2016-11-24 DIAGNOSIS — Z6831 Body mass index (BMI) 31.0-31.9, adult: Secondary | ICD-10-CM | POA: Diagnosis not present

## 2016-11-24 DIAGNOSIS — J45909 Unspecified asthma, uncomplicated: Secondary | ICD-10-CM | POA: Diagnosis not present

## 2016-11-25 ENCOUNTER — Encounter: Payer: Self-pay | Admitting: Interventional Cardiology

## 2016-11-25 ENCOUNTER — Telehealth: Payer: Self-pay

## 2016-11-25 NOTE — Telephone Encounter (Signed)
SENT NOTES TO SCHEDULING 

## 2016-11-26 ENCOUNTER — Encounter: Payer: Self-pay | Admitting: Interventional Cardiology

## 2016-11-26 ENCOUNTER — Ambulatory Visit (INDEPENDENT_AMBULATORY_CARE_PROVIDER_SITE_OTHER): Payer: BLUE CROSS/BLUE SHIELD | Admitting: Interventional Cardiology

## 2016-11-26 ENCOUNTER — Encounter (INDEPENDENT_AMBULATORY_CARE_PROVIDER_SITE_OTHER): Payer: Self-pay

## 2016-11-26 VITALS — BP 138/78 | HR 76 | Ht 62.0 in | Wt 158.0 lb

## 2016-11-26 DIAGNOSIS — R002 Palpitations: Secondary | ICD-10-CM | POA: Diagnosis not present

## 2016-11-26 DIAGNOSIS — I10 Essential (primary) hypertension: Secondary | ICD-10-CM | POA: Diagnosis not present

## 2016-11-26 DIAGNOSIS — I341 Nonrheumatic mitral (valve) prolapse: Secondary | ICD-10-CM

## 2016-11-26 NOTE — Progress Notes (Signed)
Cardiology Office Note   Date:  11/26/2016   ID:  [name] [name], DOB 1955/06/07, MRN [id number]  PCP:  Lewis Moccasin, MD    No chief complaint on file. palpitations   Wt Readings from Last 3 Encounters:  11/26/16 158 lb (71.7 kg)  09/24/15 142 lb (64.4 kg)  06/26/14 146 lb 9.6 oz (66.5 kg)       History of Present Illness: [name] [name] is a 61 y.o. female  With palpitations, and h/o polycystic kidney disease.  [name] [name] is a 61 y.o. female who is being seen today for the evaluation of palpitations at the request of Lewis Moccasin, MD.  [facility] has had PVC and PACs in the past, diagnosed in 2010.  She   She had a w/u for the palpitations in 2010.    She has gained weight in the past few years.    Her palpitations are twice a day, typically lasting 30 minutes.  She can have some fatigue on some days with these.   She exercises with an exercise bike.  No problems with that.  She also has asthma. Denies :  Dizziness. Leg edema. Nitroglycerin use. Orthopnea. Palpitations. Paroxysmal nocturnal dyspnea. Shortness of breath. Syncope.   Had angioedema with ACE-I. Parents with heart disease, both were my patients, Baldo Ash and [employer].     Past Medical History:  Diagnosis Date  . Asthma   . Hypertension   . Polycystic kidney disease    followed at [facility], stage II PKD  . Pruritus ani   . PVC (premature ventricular contraction)   . Renal stone   . Spinal stenosis     Past Surgical History:  Procedure Laterality Date  . AUGMENTATION MAMMAPLASTY    . BREAST ENHANCEMENT SURGERY  1999  . DILATION AND CURETTAGE OF UTERUS  1992   missed AB  . TUBAL LIGATION Bilateral 1993     Current Outpatient Prescriptions  Medication Sig Dispense Refill  . amLODipine (NORVASC) 5 MG tablet Take 5 mg by mouth daily.     . calcium carbonate (OS-CAL) 600 MG TABS Take 600 mg by mouth daily.      . cetirizine (ZYRTEC) 10 MG tablet Take 10 mg by mouth  daily.      Marland Kitchen LORazepam (ATIVAN) 0.5 MG tablet Take 0.5 mg by mouth as needed.    . Multiple Vitamin (MULTIVITAMIN) capsule Take 1 capsule by mouth daily.      Marland Kitchen PROAIR HFA 108 (90 BASE) MCG/ACT inhaler as needed.     . SYMBICORT 160-4.5 MCG/ACT inhaler Inhale 2 puffs into the lungs daily.     Marland Kitchen triamcinolone (NASACORT) 55 MCG/ACT AERO nasal inhaler 2 sprays daily as needed. nasal     No current facility-administered medications for this visit.     Allergies:   Ace inhibitors; Cefuroxime axetil; Irbesartan; Prinivil [lisinopril]; Sulfonamide derivatives; and Fexofenadine    Social History:  The patient  reports that she has never smoked. She has never used smokeless tobacco. She reports that she does not drink alcohol or use drugs.   Family History:  The patient's family history includes Cancer in her maternal aunt; Hyperlipidemia in her mother; Hypertension in her father and mother; Stroke in her mother.    ROS:  Please see the history of present illness.   Otherwise, review of systems are positive for Palpitations.   All other systems are reviewed and negative.    PHYSICAL EXAM: VS:  BP  138/78   Pulse 76   Ht  (1.575 m)   Wt 158 lb (71.7 kg)   LMP 03/03/2007 (Approximate)   SpO2 99%   BMI 28.90 kg/m  , BMI Body mass index is 28.9 kg/m. GEN: Well nourished, well developed, in no acute distress  HEENT: normal  Neck: no JVD, carotid bruits, or masses Cardiac: RRR; no murmurs, rubs, or gallops,no edema  Respiratory:  clear to auscultation bilaterally, normal work of breathing GI: soft, nontender, nondistended, + BS MS: no deformity or atrophy  Skin: warm and dry, no rash Neuro:  Strength and sensation are intact Psych: euthymic mood, full affect   EKG:   The ekg ordered today demonstrates Normal sinus rhythm, no ST segment changes   Recent Labs: No results found for requested labs within last 8760 hours.   Lipid Panel    Component Value Date/Time   CHOL 163  12/12/2007 0750   TRIG 159 (H) 12/12/2007 0750   HDL 52.8 12/12/2007 0750   CHOLHDL 3.1 CALC 12/12/2007 0750   VLDL 32 12/12/2007 0750   LDLCALC 78 12/12/2007 0750     Other studies Reviewed: Additional studies/ records that were reviewed today with results demonstrating: 2010 monitor reviewed and showed only PACs and PVCs..   ASSESSMENT AND PLAN:  1. Palpitations: plan for event monitor.  May be a recurrence of PVCs or PACs.. 2. HTN: Not a candidate for ACE-I.  continue Norvasc. 3. MVP: mild regurgitation in 2010.  Recheck echocardiogram to see if there is any change.   Current medicines are reviewed at length with the patient today.  The patient concerns regarding her medicines were addressed.  The following changes have been made:  No change  Labs/ tests ordered today include:  No orders of the defined types were placed in this encounter.   Recommend 150 minutes/week of aerobic exercise Low fat, low carb, high fiber diet recommended  Disposition:   FU based on test results   Signed, Lance Muss, MD  11/26/2016 9:19 AM    [facility] [address] [place], [place], [place]  [address] Phone: [phone]; Fax: [phone]

## 2016-11-26 NOTE — Patient Instructions (Signed)
Medication Instructions:  Your physician recommends that you continue on your current medications as directed. Please refer to the Current Medication list given to you today.   Labwork: None ordered  Testing/Procedures: Your physician has requested that you have an echocardiogram. Echocardiography is a painless test that uses sound waves to create images of your heart. It provides your doctor with information about the size and shape of your heart and how well your heart's chambers and valves are working. This procedure takes approximately one hour. There are no restrictions for this procedure.  Your physician has recommended that you wear an event monitor. Event monitors are medical devices that record the heart's electrical activity. Doctors most often [place] these monitors to diagnose arrhythmias. Arrhythmias are problems with the speed or rhythm of the heartbeat. The monitor is a small, portable device. You can wear one while you do your normal daily activities. This is usually used to diagnose what is causing palpitations/syncope (passing out).   Follow-Up: Base on test results   Any Other Special Instructions Will Be Listed Below (If Applicable).     If you need a refill on your cardiac medications before your next appointment, please call your pharmacy.

## 2016-11-27 ENCOUNTER — Ambulatory Visit (HOSPITAL_COMMUNITY): Payer: BLUE CROSS/BLUE SHIELD | Attending: Cardiovascular Disease

## 2016-11-27 ENCOUNTER — Other Ambulatory Visit: Payer: Self-pay

## 2016-11-27 DIAGNOSIS — I341 Nonrheumatic mitral (valve) prolapse: Secondary | ICD-10-CM

## 2016-11-27 DIAGNOSIS — I080 Rheumatic disorders of both mitral and aortic valves: Secondary | ICD-10-CM | POA: Insufficient documentation

## 2016-11-27 DIAGNOSIS — I10 Essential (primary) hypertension: Secondary | ICD-10-CM | POA: Diagnosis not present

## 2016-11-30 ENCOUNTER — Telehealth: Payer: Self-pay | Admitting: Interventional Cardiology

## 2016-11-30 NOTE — Telephone Encounter (Signed)
-----   Message from Corky Crafts, MD sent at 11/27/2016  2:09 PM EDT ----- Normal LV function.  Adequate valvular function.  Mitral regurgitation unchanged since prior study.

## 2016-11-30 NOTE — Telephone Encounter (Signed)
New Message   pt verbalized that she is returning call for rn   For echo results

## 2016-11-30 NOTE — Telephone Encounter (Signed)
Patient made aware of results. Patient verbalizes understanding.  

## 2016-12-11 ENCOUNTER — Ambulatory Visit (INDEPENDENT_AMBULATORY_CARE_PROVIDER_SITE_OTHER): Payer: BLUE CROSS/BLUE SHIELD

## 2016-12-11 DIAGNOSIS — R002 Palpitations: Secondary | ICD-10-CM

## 2016-12-22 ENCOUNTER — Ambulatory Visit: Payer: BLUE CROSS/BLUE SHIELD | Admitting: Interventional Cardiology

## 2016-12-29 ENCOUNTER — Ambulatory Visit (INDEPENDENT_AMBULATORY_CARE_PROVIDER_SITE_OTHER): Payer: BLUE CROSS/BLUE SHIELD | Admitting: Obstetrics & Gynecology

## 2016-12-29 ENCOUNTER — Encounter: Payer: Self-pay | Admitting: Obstetrics & Gynecology

## 2016-12-29 VITALS — BP 130/72 | HR 76 | Resp 12 | Ht 61.0 in | Wt 159.0 lb

## 2016-12-29 DIAGNOSIS — Z23 Encounter for immunization: Secondary | ICD-10-CM | POA: Diagnosis not present

## 2016-12-29 DIAGNOSIS — Z6830 Body mass index (BMI) 30.0-30.9, adult: Secondary | ICD-10-CM | POA: Diagnosis not present

## 2016-12-29 DIAGNOSIS — Q619 Cystic kidney disease, unspecified: Secondary | ICD-10-CM | POA: Diagnosis not present

## 2016-12-29 DIAGNOSIS — I493 Ventricular premature depolarization: Secondary | ICD-10-CM | POA: Diagnosis not present

## 2016-12-29 DIAGNOSIS — Z01419 Encounter for gynecological examination (general) (routine) without abnormal findings: Secondary | ICD-10-CM

## 2016-12-29 DIAGNOSIS — I10 Essential (primary) hypertension: Secondary | ICD-10-CM | POA: Diagnosis not present

## 2016-12-29 NOTE — Progress Notes (Signed)
61 y.o. [id number] MarriedCaucasianF here for annual exam.  Entire family has had hand-foot-mouth this past year.  Everyone is feeling better.  Father died in March.  He was living with the patient for the last year of his life.  Getting his house ready for the market.  Denies vaginal bleeding.  Having increased palpitations.  Wore heart monitor for three weeks.  Just took it off yesterday.    PCP:  Dr. Duanne Guessewey.  Did blood work with work.  Patient's last menstrual period was 03/03/2007 (approximate).          Sexually active: Yes.    The current method of family planning is post menopausal status.    Exercising: Yes.    walking, exercise bike Smoker:  no  Health Maintenance: Pap:  09/24/15 negative, HR HPV negative, 09/14/13 negative History of abnormal Pap:  no MMG:  12/30/15 BIRADS 1 negative  Colonoscopy: 10/28/07  BMD:   01/14/16 [facility] TDaP:  11/23/12  Pneumonia vaccine(s):  Unsure of date Shingrix:   12/29/16.  Will return to Dr. Duanne Guessewey in 2-3 months for repeat Hep C testing: discuss with provider Screening Labs: done through work- has copy today, Hb today: same   reports that she has never smoked. She has never used smokeless tobacco. She reports that she does not drink alcohol or use drugs.  Past Medical History:  Diagnosis Date  . Asthma   . Hypertension   . Polycystic kidney disease    [facility], stage II PKD  . Pruritus ani   . PVC (premature ventricular contraction)   . Renal stone   . Spinal stenosis     Past Surgical History:  Procedure Laterality Date  . AUGMENTATION MAMMAPLASTY    . BREAST ENHANCEMENT SURGERY  1999  . DILATION AND CURETTAGE OF UTERUS  1992   missed AB  . TUBAL LIGATION Bilateral 1993    Current Outpatient Prescriptions  Medication Sig Dispense Refill  . amLODipine (NORVASC) 5 MG tablet Take 5 mg by mouth daily.     . calcium carbonate (OS-CAL) 600 MG TABS Take 600 mg by mouth daily.      . cetirizine  (ZYRTEC) 10 MG tablet Take 10 mg by mouth daily.      Marland Kitchen. LORazepam (ATIVAN) 0.5 MG tablet Take 0.5 mg by mouth as needed.    . Multiple Vitamin (MULTIVITAMIN) capsule Take 1 capsule by mouth daily.      Marland Kitchen. PROAIR HFA 108 (90 BASE) MCG/ACT inhaler as needed.     . SYMBICORT 160-4.5 MCG/ACT inhaler Inhale 2 puffs into the lungs daily.     Marland Kitchen. triamcinolone (NASACORT) 55 MCG/ACT AERO nasal inhaler 2 sprays daily as needed. nasal     No current facility-administered medications for this visit.     Family History  Problem Relation Age of Onset  . Hypertension Mother   . Hyperlipidemia Mother   . Stroke Mother   . Hypertension Father   . Cancer Maternal Aunt        breast    ROS:  Pertinent items are noted in HPI.  Otherwise, a comprehensive ROS was negative.  Exam:   BP 130/72 (BP Location: Right Arm, Patient Position: Sitting, Cuff Size: Normal)   Pulse 76   Resp 12   Ht 5\' 1"  (1.549 m)   Wt 159 lb (72.1 kg)   LMP 03/03/2007 (Approximate)   BMI 30.04 kg/m     Height: 5\' 1"  (154.9 cm)  Ht  Readings from Last 3 Encounters:  12/29/16 5\' 1"  (1.549 m)  11/26/16 5\' 2"  (1.575 m)  09/24/15 5\' 1"  (1.549 m)    General appearance: alert, cooperative and appears stated age Head: Normocephalic, without obvious abnormality, atraumatic Neck: no adenopathy, supple, symmetrical, trachea midline and thyroid normal to inspection and palpation Lungs: clear to auscultation bilaterally Breasts: normal appearance, no masses or tenderness Heart: regular rate and rhythm Abdomen: soft, non-tender; bowel sounds normal; no masses,  no organomegaly Extremities: extremities normal, atraumatic, no cyanosis or edema Skin: Skin color, texture, turgor normal. No rashes or lesions Lymph nodes: Cervical, supraclavicular, and axillary nodes normal. No abnormal inguinal nodes palpated Neurologic: Grossly normal   Pelvic: External genitalia:  no lesions              Urethra:  normal appearing urethra with no  masses, tenderness or lesions              Bartholins and Skenes: normal                 Vagina: normal appearing vagina with normal color and discharge, no lesions              Cervix: no lesions              Pap taken: No. Bimanual Exam:  Uterus:  normal              Adnexa: no mass, fullness, tenderness               Rectovaginal: Confirms               Anus:  normal sphincter tone, no lesions  Chaperone was present for exam.  A:  Well Woman with normal exam PMP, no HRT Adult PCKD, stage.  Followed by Dr. Lawana Pai. Hypertension Palpitations Asthma  P:   Mammogram guidelines reviewed.  Doing yearly. pap smear with neg HR HPV 2017.  No pap smear obtained today. Declines Hep C testing today.  Lab work done with job about a month ago. return annually or prn

## 2017-01-04 ENCOUNTER — Ambulatory Visit
Admission: RE | Admit: 2017-01-04 | Discharge: 2017-01-04 | Disposition: A | Discharge status: Home / Self Care | Payer: BLUE CROSS/BLUE SHIELD | Source: Ambulatory Visit | Attending: Obstetrics & Gynecology | Admitting: Obstetrics & Gynecology

## 2017-01-04 DIAGNOSIS — Z1231 Encounter for screening mammogram for malignant neoplasm of breast: Secondary | ICD-10-CM

## 2017-01-19 ENCOUNTER — Telehealth: Payer: Self-pay | Admitting: Interventional Cardiology

## 2017-01-19 NOTE — Telephone Encounter (Signed)
New message  Pt call requesting to speak with Rn about getting monitor results. Please call back to discuss

## 2017-01-19 NOTE — Telephone Encounter (Signed)
Patient made aware of results. Patient verbalizes understanding.   Notes recorded by Corky CraftsVaranasi, Jayadeep S, MD on 01/15/2017 at 3:18 PM EST As noted in the results. PVCs likely causing symptoms.

## 2017-02-01 DIAGNOSIS — D225 Melanocytic nevi of trunk: Secondary | ICD-10-CM | POA: Diagnosis not present

## 2017-02-01 DIAGNOSIS — L821 Other seborrheic keratosis: Secondary | ICD-10-CM | POA: Diagnosis not present

## 2017-02-01 DIAGNOSIS — L570 Actinic keratosis: Secondary | ICD-10-CM | POA: Diagnosis not present

## 2017-02-01 DIAGNOSIS — D1801 Hemangioma of skin and subcutaneous tissue: Secondary | ICD-10-CM | POA: Diagnosis not present

## 2017-02-02 DIAGNOSIS — Q612 Polycystic kidney, adult type: Secondary | ICD-10-CM | POA: Diagnosis not present

## 2017-02-02 DIAGNOSIS — N182 Chronic kidney disease, stage 2 (mild): Secondary | ICD-10-CM | POA: Diagnosis not present

## 2017-02-02 DIAGNOSIS — R768 Other specified abnormal immunological findings in serum: Secondary | ICD-10-CM | POA: Diagnosis not present

## 2017-02-02 DIAGNOSIS — I129 Hypertensive chronic kidney disease with stage 1 through stage 4 chronic kidney disease, or unspecified chronic kidney disease: Secondary | ICD-10-CM | POA: Diagnosis not present

## 2017-04-13 DIAGNOSIS — H43812 Vitreous degeneration, left eye: Secondary | ICD-10-CM | POA: Diagnosis not present

## 2017-04-15 DIAGNOSIS — J069 Acute upper respiratory infection, unspecified: Secondary | ICD-10-CM | POA: Diagnosis not present

## 2017-04-15 DIAGNOSIS — R05 Cough: Secondary | ICD-10-CM | POA: Diagnosis not present

## 2017-04-19 ENCOUNTER — Ambulatory Visit
Admission: RE | Admit: 2017-04-19 | Discharge: 2017-04-19 | Disposition: A | Discharge status: Home / Self Care | Payer: BLUE CROSS/BLUE SHIELD | Source: Ambulatory Visit | Attending: Nurse Practitioner | Admitting: Nurse Practitioner

## 2017-04-19 ENCOUNTER — Other Ambulatory Visit: Payer: Self-pay | Admitting: Nurse Practitioner

## 2017-04-19 DIAGNOSIS — J4520 Mild intermittent asthma, uncomplicated: Secondary | ICD-10-CM | POA: Diagnosis not present

## 2017-04-19 DIAGNOSIS — J101 Influenza due to other identified influenza virus with other respiratory manifestations: Secondary | ICD-10-CM | POA: Diagnosis not present

## 2017-04-19 DIAGNOSIS — J45998 Other asthma: Secondary | ICD-10-CM | POA: Diagnosis not present

## 2017-04-19 DIAGNOSIS — R05 Cough: Secondary | ICD-10-CM | POA: Diagnosis not present

## 2017-04-19 DIAGNOSIS — R52 Pain, unspecified: Secondary | ICD-10-CM | POA: Diagnosis not present

## 2017-04-28 DIAGNOSIS — H43812 Vitreous degeneration, left eye: Secondary | ICD-10-CM | POA: Diagnosis not present

## 2017-05-31 DIAGNOSIS — Q613 Polycystic kidney, unspecified: Secondary | ICD-10-CM | POA: Diagnosis not present

## 2017-05-31 DIAGNOSIS — I10 Essential (primary) hypertension: Secondary | ICD-10-CM | POA: Diagnosis not present

## 2017-05-31 DIAGNOSIS — J4520 Mild intermittent asthma, uncomplicated: Secondary | ICD-10-CM | POA: Diagnosis not present

## 2017-05-31 DIAGNOSIS — F411 Generalized anxiety disorder: Secondary | ICD-10-CM | POA: Diagnosis not present

## 2017-06-07 DIAGNOSIS — H00014 Hordeolum externum left upper eyelid: Secondary | ICD-10-CM | POA: Diagnosis not present

## 2017-06-16 ENCOUNTER — Other Ambulatory Visit: Payer: Self-pay | Admitting: Obstetrics & Gynecology

## 2017-06-16 DIAGNOSIS — Z1231 Encounter for screening mammogram for malignant neoplasm of breast: Secondary | ICD-10-CM

## 2017-06-16 DIAGNOSIS — Z23 Encounter for immunization: Secondary | ICD-10-CM | POA: Diagnosis not present

## 2017-11-16 DIAGNOSIS — Z01818 Encounter for other preprocedural examination: Secondary | ICD-10-CM | POA: Diagnosis not present

## 2017-12-01 DIAGNOSIS — D122 Benign neoplasm of ascending colon: Secondary | ICD-10-CM | POA: Diagnosis not present

## 2017-12-01 DIAGNOSIS — K635 Polyp of colon: Secondary | ICD-10-CM | POA: Diagnosis not present

## 2017-12-01 DIAGNOSIS — Z1211 Encounter for screening for malignant neoplasm of colon: Secondary | ICD-10-CM | POA: Diagnosis not present

## 2017-12-03 DIAGNOSIS — K635 Polyp of colon: Secondary | ICD-10-CM | POA: Diagnosis not present

## 2017-12-03 DIAGNOSIS — D122 Benign neoplasm of ascending colon: Secondary | ICD-10-CM | POA: Diagnosis not present

## 2017-12-21 DIAGNOSIS — L821 Other seborrheic keratosis: Secondary | ICD-10-CM | POA: Diagnosis not present

## 2017-12-21 DIAGNOSIS — L814 Other melanin hyperpigmentation: Secondary | ICD-10-CM | POA: Diagnosis not present

## 2017-12-21 DIAGNOSIS — L570 Actinic keratosis: Secondary | ICD-10-CM | POA: Diagnosis not present

## 2017-12-21 DIAGNOSIS — D1801 Hemangioma of skin and subcutaneous tissue: Secondary | ICD-10-CM | POA: Diagnosis not present

## 2017-12-21 DIAGNOSIS — D229 Melanocytic nevi, unspecified: Secondary | ICD-10-CM | POA: Diagnosis not present

## 2018-01-04 ENCOUNTER — Encounter: Payer: Self-pay | Admitting: Obstetrics & Gynecology

## 2018-01-04 ENCOUNTER — Ambulatory Visit (INDEPENDENT_AMBULATORY_CARE_PROVIDER_SITE_OTHER): Payer: BLUE CROSS/BLUE SHIELD | Admitting: Obstetrics & Gynecology

## 2018-01-04 ENCOUNTER — Other Ambulatory Visit: Payer: Self-pay

## 2018-01-04 VITALS — BP 156/86 | HR 76 | Resp 16 | Ht 61.0 in | Wt 161.2 lb

## 2018-01-04 DIAGNOSIS — Z01419 Encounter for gynecological examination (general) (routine) without abnormal findings: Secondary | ICD-10-CM | POA: Diagnosis not present

## 2018-01-04 DIAGNOSIS — I10 Essential (primary) hypertension: Secondary | ICD-10-CM | POA: Diagnosis not present

## 2018-01-04 DIAGNOSIS — J4520 Mild intermittent asthma, uncomplicated: Secondary | ICD-10-CM | POA: Diagnosis not present

## 2018-01-04 DIAGNOSIS — J0100 Acute maxillary sinusitis, unspecified: Secondary | ICD-10-CM | POA: Diagnosis not present

## 2018-01-04 DIAGNOSIS — Z23 Encounter for immunization: Secondary | ICD-10-CM | POA: Diagnosis not present

## 2018-01-04 NOTE — Progress Notes (Signed)
62 y.o. [id number] Married White or Caucasian female here for annual exam.  Had URI last week.  Was started on steroids.  This helped but now having ear pain on the right.  Denies vaginal bleeding.    Is still seeing nephrology--Dr. Lawana Pai.  Has been calling since Oct.  Still does not have appt.  Feels like she is having some bladder spasms as well.  Having more urinary leakage with cough or sneeze.  Doesn't feel like she drinks as much as she should (water) because of urinary urgency.  PCP:  Dr. Chales Salmon  Patient's last menstrual period was 03/03/2007 (approximate).          Sexually active: Yes.    The current method of family planning is post menopausal status.    Exercising: Yes.    walking, bike Smoker:  no  Health Maintenance: Pap:  09/24/15 Neg. HR HPV:neg   09/14/13 Neg  History of abnormal Pap:  no MMG:  01/04/17 BIRADS1:Neg. Has appt 01/10/18  Colonoscopy:  11/2017 polyps. F/u 5 years. [facility]. BMD:   01/14/16 TDaP:  2014 Pneumonia vaccine(s):  PCP Shingrix:   Completed  Hep C testing: done Screening Labs: PCP   reports that she has never smoked. She has never used smokeless tobacco. She reports that she does not drink alcohol or use drugs.  Past Medical History:  Diagnosis Date  . Asthma   . Hypertension   . Polycystic kidney disease    followed at [facility], stage II PKD  . Pruritus ani   . PVC (premature ventricular contraction)   . Renal stone   . Spinal stenosis     Past Surgical History:  Procedure Laterality Date  . AUGMENTATION MAMMAPLASTY    . BREAST ENHANCEMENT SURGERY  1999  . DILATION AND CURETTAGE OF UTERUS  1992   missed AB  . TUBAL LIGATION Bilateral 1993    Current Outpatient Medications  Medication Sig Dispense Refill  . amLODipine (NORVASC) 5 MG tablet Take 5 mg by mouth daily.     . calcium carbonate (OS-CAL) 600 MG TABS Take 600 mg by mouth daily.      . cetirizine (ZYRTEC) 10 MG tablet Take 10 mg by mouth daily.      Marland Kitchen  LORazepam (ATIVAN) 0.5 MG tablet Take 0.5 mg by mouth as needed.    . montelukast (SINGULAIR) 10 MG tablet Take 10 mg by mouth daily.  0  . Multiple Vitamin (MULTIVITAMIN) capsule Take 1 capsule by mouth daily.      Marland Kitchen PROAIR HFA 108 (90 BASE) MCG/ACT inhaler as needed.     . SYMBICORT 160-4.5 MCG/ACT inhaler Inhale 2 puffs into the lungs daily.     Marland Kitchen triamcinolone (NASACORT) 55 MCG/ACT AERO nasal inhaler 2 sprays daily as needed. nasal     No current facility-administered medications for this visit.     Family History  Problem Relation Age of Onset  . Hypertension Mother   . Hyperlipidemia Mother   . Stroke Mother   . Hypertension Father   . Cancer Maternal Aunt        breast  . Breast cancer Maternal Aunt     Review of Systems  All other systems reviewed and are negative.   Exam:   BP (!) 156/86 (BP Location: Right Arm, Patient Position: Sitting, Cuff Size: Large)   Pulse 76   Resp 16   Ht 5\' 1"  (1.549 m)   Wt 161 lb 3.2 oz (73.1 kg)   LMP  03/03/2007 (Approximate)   BMI 30.46 kg/m   Height: 5\' 1"  (154.9 cm)  Ht Readings from Last 3 Encounters:  01/04/18 5\' 1"  (1.549 m)  12/29/16 5\' 1"  (1.549 m)  11/26/16 5\' 2"  (1.575 m)    General appearance: alert, cooperative and appears stated age Head: Normocephalic, without obvious abnormality, atraumatic Neck: no adenopathy, supple, symmetrical, trachea midline and thyroid normal to inspection and palpation Lungs: clear to auscultation bilaterally Breasts: normal appearance, no masses or tenderness Heart: regular rate and rhythm Abdomen: soft, non-tender; bowel sounds normal; no masses,  no organomegaly Extremities: extremities normal, atraumatic, no cyanosis or edema Skin: Skin color, texture, turgor normal. No rashes or lesions Lymph nodes: Cervical, supraclavicular, and axillary nodes normal. No abnormal inguinal nodes palpated Neurologic: Grossly normal   Pelvic: External genitalia:  no lesions              Urethra:   normal appearing urethra with no masses, tenderness or lesions              Bartholins and Skenes: normal                 Vagina: normal appearing vagina with normal color and discharge, no lesions              Cervix: no lesions              Pap taken: No. Bimanual Exam:  Uterus:  normal size, contour, position, consistency, mobility, non-tender              Adnexa: normal adnexa and no mass, fullness, tenderness               Rectovaginal: Confirms               Anus:  normal sphincter tone, no lesions  Chaperone was present for exam.  A:  Well Woman with normal exam PMP PCK, stage 2.  Followed by Dr. Lawana Pai Polycystic liver changes as well Hypertension [place]  P:   Mammogram guidelines reviewed.  Doing yearly. pap smear with neg HR HPV 2017 Will check with Dr. Seth Bake about having Hep C testing done Having trouble making appt with Dr. Lawana Pai Colonoscopy is UTD Pelvic PT discussed return annually or prn

## 2018-01-10 ENCOUNTER — Ambulatory Visit
Admission: RE | Admit: 2018-01-10 | Discharge: 2018-01-10 | Disposition: A | Discharge status: Home / Self Care | Payer: BLUE CROSS/BLUE SHIELD | Source: Ambulatory Visit | Attending: Obstetrics & Gynecology | Admitting: Obstetrics & Gynecology

## 2018-01-10 DIAGNOSIS — Z1231 Encounter for screening mammogram for malignant neoplasm of breast: Secondary | ICD-10-CM | POA: Diagnosis not present

## 2018-01-17 ENCOUNTER — Other Ambulatory Visit: Payer: Self-pay | Admitting: Internal Medicine

## 2018-01-17 ENCOUNTER — Ambulatory Visit
Admission: RE | Admit: 2018-01-17 | Discharge: 2018-01-17 | Disposition: A | Discharge status: Home / Self Care | Payer: BLUE CROSS/BLUE SHIELD | Source: Ambulatory Visit | Attending: Internal Medicine | Admitting: Internal Medicine

## 2018-01-17 DIAGNOSIS — J40 Bronchitis, not specified as acute or chronic: Secondary | ICD-10-CM | POA: Diagnosis not present

## 2018-01-17 DIAGNOSIS — J4541 Moderate persistent asthma with (acute) exacerbation: Secondary | ICD-10-CM | POA: Diagnosis not present

## 2018-01-17 DIAGNOSIS — R05 Cough: Secondary | ICD-10-CM | POA: Diagnosis not present

## 2018-02-03 ENCOUNTER — Encounter: Payer: Self-pay | Admitting: Internal Medicine

## 2018-02-07 DIAGNOSIS — N182 Chronic kidney disease, stage 2 (mild): Secondary | ICD-10-CM | POA: Diagnosis not present

## 2018-02-17 ENCOUNTER — Other Ambulatory Visit: Payer: Self-pay | Admitting: Nephrology

## 2018-02-17 DIAGNOSIS — Q612 Polycystic kidney, adult type: Secondary | ICD-10-CM | POA: Diagnosis not present

## 2018-02-17 DIAGNOSIS — Q613 Polycystic kidney, unspecified: Secondary | ICD-10-CM

## 2018-02-17 DIAGNOSIS — I129 Hypertensive chronic kidney disease with stage 1 through stage 4 chronic kidney disease, or unspecified chronic kidney disease: Secondary | ICD-10-CM | POA: Diagnosis not present

## 2018-02-17 DIAGNOSIS — Z87442 Personal history of urinary calculi: Secondary | ICD-10-CM | POA: Diagnosis not present

## 2018-02-17 DIAGNOSIS — N182 Chronic kidney disease, stage 2 (mild): Secondary | ICD-10-CM

## 2018-02-24 ENCOUNTER — Other Ambulatory Visit: Payer: BLUE CROSS/BLUE SHIELD

## 2018-02-25 ENCOUNTER — Ambulatory Visit
Admission: RE | Admit: 2018-02-25 | Discharge: 2018-02-25 | Disposition: A | Discharge status: Home / Self Care | Payer: BLUE CROSS/BLUE SHIELD | Source: Ambulatory Visit | Attending: Nephrology | Admitting: Nephrology

## 2018-02-25 DIAGNOSIS — N182 Chronic kidney disease, stage 2 (mild): Secondary | ICD-10-CM | POA: Diagnosis not present

## 2018-02-25 DIAGNOSIS — Q613 Polycystic kidney, unspecified: Secondary | ICD-10-CM

## 2018-03-28 ENCOUNTER — Ambulatory Visit: Payer: BLUE CROSS/BLUE SHIELD | Admitting: Obstetrics & Gynecology

## 2018-07-04 DIAGNOSIS — M25561 Pain in right knee: Secondary | ICD-10-CM | POA: Diagnosis not present

## 2018-08-10 DIAGNOSIS — Q613 Polycystic kidney, unspecified: Secondary | ICD-10-CM | POA: Diagnosis not present

## 2018-08-10 DIAGNOSIS — F411 Generalized anxiety disorder: Secondary | ICD-10-CM | POA: Diagnosis not present

## 2018-08-10 DIAGNOSIS — I10 Essential (primary) hypertension: Secondary | ICD-10-CM | POA: Diagnosis not present

## 2018-08-10 DIAGNOSIS — R635 Abnormal weight gain: Secondary | ICD-10-CM | POA: Diagnosis not present

## 2018-08-10 DIAGNOSIS — M545 Low back pain: Secondary | ICD-10-CM | POA: Diagnosis not present

## 2018-08-10 DIAGNOSIS — J4520 Mild intermittent asthma, uncomplicated: Secondary | ICD-10-CM | POA: Diagnosis not present

## 2018-08-10 DIAGNOSIS — M25561 Pain in right knee: Secondary | ICD-10-CM | POA: Diagnosis not present

## 2018-08-17 ENCOUNTER — Other Ambulatory Visit: Payer: Self-pay | Admitting: Orthopaedic Surgery

## 2018-08-17 DIAGNOSIS — M25561 Pain in right knee: Secondary | ICD-10-CM

## 2018-09-24 ENCOUNTER — Ambulatory Visit
Admission: RE | Admit: 2018-09-24 | Discharge: 2018-09-24 | Disposition: A | Discharge status: Home / Self Care | Payer: BLUE CROSS/BLUE SHIELD | Source: Ambulatory Visit | Attending: Orthopaedic Surgery | Admitting: Orthopaedic Surgery

## 2018-09-24 ENCOUNTER — Other Ambulatory Visit: Payer: Self-pay

## 2018-09-24 DIAGNOSIS — M48061 Spinal stenosis, lumbar region without neurogenic claudication: Secondary | ICD-10-CM | POA: Diagnosis not present

## 2018-09-24 DIAGNOSIS — M25561 Pain in right knee: Secondary | ICD-10-CM

## 2018-10-03 DIAGNOSIS — M545 Low back pain: Secondary | ICD-10-CM | POA: Diagnosis not present

## 2018-10-03 DIAGNOSIS — M25561 Pain in right knee: Secondary | ICD-10-CM | POA: Diagnosis not present

## 2018-10-10 ENCOUNTER — Other Ambulatory Visit: Payer: Self-pay | Admitting: Physical Medicine and Rehabilitation

## 2018-10-10 DIAGNOSIS — Z1231 Encounter for screening mammogram for malignant neoplasm of breast: Secondary | ICD-10-CM

## 2018-10-10 DIAGNOSIS — M47816 Spondylosis without myelopathy or radiculopathy, lumbar region: Secondary | ICD-10-CM | POA: Diagnosis not present

## 2018-10-25 DIAGNOSIS — M533 Sacrococcygeal disorders, not elsewhere classified: Secondary | ICD-10-CM | POA: Diagnosis not present

## 2018-11-09 DIAGNOSIS — M533 Sacrococcygeal disorders, not elsewhere classified: Secondary | ICD-10-CM | POA: Diagnosis not present

## 2018-11-29 DIAGNOSIS — L820 Inflamed seborrheic keratosis: Secondary | ICD-10-CM | POA: Diagnosis not present

## 2018-11-29 DIAGNOSIS — D225 Melanocytic nevi of trunk: Secondary | ICD-10-CM | POA: Diagnosis not present

## 2018-12-04 DIAGNOSIS — N3000 Acute cystitis without hematuria: Secondary | ICD-10-CM | POA: Diagnosis not present

## 2018-12-04 DIAGNOSIS — N3090 Cystitis, unspecified without hematuria: Secondary | ICD-10-CM | POA: Diagnosis not present

## 2018-12-10 DIAGNOSIS — R509 Fever, unspecified: Secondary | ICD-10-CM | POA: Diagnosis not present

## 2018-12-10 DIAGNOSIS — J069 Acute upper respiratory infection, unspecified: Secondary | ICD-10-CM | POA: Diagnosis not present

## 2018-12-10 DIAGNOSIS — R05 Cough: Secondary | ICD-10-CM | POA: Diagnosis not present

## 2019-01-05 ENCOUNTER — Other Ambulatory Visit: Payer: Self-pay

## 2019-01-10 ENCOUNTER — Other Ambulatory Visit: Payer: Self-pay

## 2019-01-10 ENCOUNTER — Ambulatory Visit: Payer: BC Managed Care – PPO | Admitting: Obstetrics & Gynecology

## 2019-01-10 ENCOUNTER — Encounter: Payer: Self-pay | Admitting: Obstetrics & Gynecology

## 2019-01-10 VITALS — BP 138/80 | HR 80 | Temp 97.5°F | Resp 12 | Ht 61.0 in | Wt 158.0 lb

## 2019-01-10 DIAGNOSIS — Z01419 Encounter for gynecological examination (general) (routine) without abnormal findings: Secondary | ICD-10-CM

## 2019-01-10 NOTE — Progress Notes (Signed)
63 y.o. [id number] Married White or Caucasian female here for annual exam.  Doing well.  Had a UTI about a month ago.  She ended up going to urgent care.  Had some associated back pain.  This has improved.  Having a lot of issues with her hands and arthritis.  She didn't have a fever with this.  Denies vaginal bleeding.    Called for appt with Dr. Vanita Panda in December.  She said they didn't have the December schedule yet.    PCP:  Dr. Fara Olden.  Has appt 01/30/2019.    Patient's last menstrual period was 03/03/2007 (approximate).          Sexually active: Yes.    The current method of family planning is post menopausal status.    Exercising: Yes.    floor exercises, walking, exercise bike Smoker:  no  Health Maintenance: Pap:  09/24/15 Neg. HR HPV:neg              09/14/13 Neg  History of abnormal Pap:  no MMG:  01/10/18 BIRADS 1 negative/density b -- scheduled 01/16/19 Colonoscopy:  11/2017 polyps. F/u 5 years. [facility] BMD:   01/14/16 TDaP:  2014 Pneumonia vaccine(s): done with PCP Shingrix:   Completed  Hep C testing: done Screening Labs: PCP   reports that she has never smoked. She has never used smokeless tobacco. She reports that she does not drink alcohol or use drugs.  Past Medical History:  Diagnosis Date  . Asthma   . Hypertension   . Polycystic kidney disease    followed at [facility], stage II PKD  . Pruritus ani   . PVC (premature ventricular contraction)   . Renal stone   . Spinal stenosis     Past Surgical History:  Procedure Laterality Date  . AUGMENTATION MAMMAPLASTY Bilateral   . BREAST ENHANCEMENT SURGERY  1999  . DILATION AND CURETTAGE OF UTERUS  1992   missed AB  . TUBAL LIGATION Bilateral 1993    Current Outpatient Medications  Medication Sig Dispense Refill  . amLODipine (NORVASC) 5 MG tablet Take 5 mg by mouth daily.     . calcium carbonate (OS-CAL) 600 MG TABS Take 600 mg by mouth daily.      . cetirizine (ZYRTEC) 10 MG tablet Take  10 mg by mouth daily.      . Cholecalciferol (D3 HIGH POTENCY) 50 MCG (2000 UT) CAPS Take by mouth.    Marland Kitchen LORazepam (ATIVAN) 0.5 MG tablet Take 0.5 mg by mouth as needed.    . montelukast (SINGULAIR) 10 MG tablet Take 10 mg by mouth daily.  0  . Multiple Vitamin (MULTIVITAMIN) capsule Take 1 capsule by mouth daily.      Marland Kitchen PROAIR HFA 108 (90 BASE) MCG/ACT inhaler as needed.     . SYMBICORT 160-4.5 MCG/ACT inhaler Inhale 2 puffs into the lungs daily.     Marland Kitchen triamcinolone (NASACORT) 55 MCG/ACT AERO nasal inhaler 2 sprays daily as needed. nasal     No current facility-administered medications for this visit.     Family History  Problem Relation Age of Onset  . Hypertension Mother   . Hyperlipidemia Mother   . Stroke Mother   . Hypertension Father   . Cancer Maternal Aunt        breast  . Breast cancer Maternal Aunt     Review of Systems  All other systems reviewed and are negative.   Exam:   BP 138/80 (BP Location: Left Arm, Patient  Position: Sitting, Cuff Size: Normal)   Pulse 80   Temp (!) 97.5 F (36.4 C) (Temporal)   Resp 12   Ht 5\' 1"  (1.549 m)   Wt 158 lb (71.7 kg)   LMP 03/03/2007 (Approximate)   BMI 29.85 kg/m     Height: 5\' 1"  (154.9 cm)  Ht Readings from Last 3 Encounters:  01/10/19 5\' 1"  (1.549 m)  01/04/18 5\' 1"  (1.549 m)  12/29/16 5\' 1"  (1.549 m)    General appearance: alert, cooperative and appears stated age Head: Normocephalic, without obvious abnormality, atraumatic Neck: no adenopathy, supple, symmetrical, trachea midline and thyroid normal to inspection and palpation Lungs: clear to auscultation bilaterally Breasts: normal appearance, no masses or tenderness Heart: regular rate and rhythm Abdomen: soft, non-tender; bowel sounds normal; no masses,  no organomegaly Extremities: extremities normal, atraumatic, no cyanosis or edema Skin: Skin color, texture, turgor normal. No rashes or lesions Lymph nodes: Cervical, supraclavicular, and axillary nodes  normal. No abnormal inguinal nodes palpated Neurologic: Grossly normal  Pelvic: External genitalia:  no lesions              Urethra:  normal appearing urethra with no masses, tenderness or lesions              Bartholins and Skenes: normal                 Vagina: normal appearing vagina with normal color and discharge, no lesions              Cervix: no lesions              Pap taken: No. Bimanual Exam:  Uterus:  normal size, contour, position, consistency, mobility, non-tender              Adnexa: normal adnexa and no mass, fullness, tenderness               Rectovaginal: Confirms               Anus:  normal sphincter tone, no lesions  Chaperone was present for exam.  A:  Well Woman with normal exam PMP, no HRT PCK, stage 2, followed by Dr. Polycystic liver  Hypertension Asthma  P:   Mammogram guidelines reviewed.  UTD.  Doing yearly. pap smear with neg HR HPV 2017.  New guidelines reviewed.  Not indicated today. Lab work scheduled for late November Colonoscopy due 2019 with polyp Vaccines UTD Return annually or prn

## 2019-01-16 ENCOUNTER — Other Ambulatory Visit: Payer: Self-pay

## 2019-01-16 ENCOUNTER — Ambulatory Visit
Admission: RE | Admit: 2019-01-16 | Discharge: 2019-01-16 | Disposition: A | Discharge status: Home / Self Care | Payer: BC Managed Care – PPO | Source: Ambulatory Visit | Attending: Physical Medicine and Rehabilitation | Admitting: Physical Medicine and Rehabilitation

## 2019-01-16 DIAGNOSIS — Z1231 Encounter for screening mammogram for malignant neoplasm of breast: Secondary | ICD-10-CM | POA: Diagnosis not present

## 2019-01-30 DIAGNOSIS — J4520 Mild intermittent asthma, uncomplicated: Secondary | ICD-10-CM | POA: Diagnosis not present

## 2019-01-30 DIAGNOSIS — I10 Essential (primary) hypertension: Secondary | ICD-10-CM | POA: Diagnosis not present

## 2019-02-08 DIAGNOSIS — J309 Allergic rhinitis, unspecified: Secondary | ICD-10-CM | POA: Diagnosis not present

## 2019-04-25 DIAGNOSIS — Q612 Polycystic kidney, adult type: Secondary | ICD-10-CM | POA: Diagnosis not present

## 2019-04-25 DIAGNOSIS — N182 Chronic kidney disease, stage 2 (mild): Secondary | ICD-10-CM | POA: Diagnosis not present

## 2019-04-25 DIAGNOSIS — Z87442 Personal history of urinary calculi: Secondary | ICD-10-CM | POA: Diagnosis not present

## 2019-04-25 DIAGNOSIS — I129 Hypertensive chronic kidney disease with stage 1 through stage 4 chronic kidney disease, or unspecified chronic kidney disease: Secondary | ICD-10-CM | POA: Diagnosis not present

## 2019-05-11 DIAGNOSIS — I10 Essential (primary) hypertension: Secondary | ICD-10-CM | POA: Diagnosis not present

## 2019-05-11 DIAGNOSIS — M19049 Primary osteoarthritis, unspecified hand: Secondary | ICD-10-CM | POA: Diagnosis not present

## 2019-05-11 DIAGNOSIS — M62838 Other muscle spasm: Secondary | ICD-10-CM | POA: Diagnosis not present

## 2019-05-11 DIAGNOSIS — M4722 Other spondylosis with radiculopathy, cervical region: Secondary | ICD-10-CM | POA: Diagnosis not present

## 2019-05-16 ENCOUNTER — Other Ambulatory Visit: Payer: Self-pay | Admitting: Internal Medicine

## 2019-05-16 ENCOUNTER — Ambulatory Visit
Admission: RE | Admit: 2019-05-16 | Discharge: 2019-05-16 | Disposition: A | Discharge status: Home / Self Care | Payer: BC Managed Care – PPO | Source: Ambulatory Visit | Attending: Internal Medicine | Admitting: Internal Medicine

## 2019-05-16 DIAGNOSIS — M4722 Other spondylosis with radiculopathy, cervical region: Secondary | ICD-10-CM

## 2019-05-16 DIAGNOSIS — R52 Pain, unspecified: Secondary | ICD-10-CM

## 2019-05-16 DIAGNOSIS — M47812 Spondylosis without myelopathy or radiculopathy, cervical region: Secondary | ICD-10-CM | POA: Diagnosis not present

## 2019-05-16 DIAGNOSIS — M19042 Primary osteoarthritis, left hand: Secondary | ICD-10-CM | POA: Diagnosis not present

## 2019-05-16 DIAGNOSIS — M19049 Primary osteoarthritis, unspecified hand: Secondary | ICD-10-CM | POA: Diagnosis not present

## 2019-05-16 DIAGNOSIS — M19041 Primary osteoarthritis, right hand: Secondary | ICD-10-CM | POA: Diagnosis not present

## 2019-06-05 DIAGNOSIS — M47812 Spondylosis without myelopathy or radiculopathy, cervical region: Secondary | ICD-10-CM | POA: Diagnosis not present

## 2019-06-17 ENCOUNTER — Ambulatory Visit: Payer: BC Managed Care – PPO | Attending: Internal Medicine

## 2019-06-17 DIAGNOSIS — Z23 Encounter for immunization: Secondary | ICD-10-CM

## 2019-06-17 NOTE — Progress Notes (Signed)
   Covid-19 Vaccination Clinic  Name:  [name] [name]    MRN: [id number] DOB: 1955/03/31  06/17/2019  [name] was observed post Covid-19 immunization for 30 minutes based on pre-vaccination screening without incident. She was provided with Vaccine Information Sheet and instruction to access the V-Safe system.   [name] was instructed to call 911 with any severe reactions post vaccine: Marland Kitchen Difficulty breathing  . Swelling of face and throat  . A fast heartbeat  . A bad rash all over body  . Dizziness and weakness   Immunizations Administered    Name Date Dose VIS Date Route   Pfizer COVID-19 Vaccine 06/17/2019  4:52 PM 0.3 mL 02/10/2019 Intramuscular   Manufacturer: [employer], [employer]   Lot: [id number]   NDC: [address]-2

## 2019-06-27 DIAGNOSIS — Z13220 Encounter for screening for lipoid disorders: Secondary | ICD-10-CM | POA: Diagnosis not present

## 2019-06-27 DIAGNOSIS — Z0000 Encounter for general adult medical examination without abnormal findings: Secondary | ICD-10-CM | POA: Diagnosis not present

## 2019-07-11 ENCOUNTER — Ambulatory Visit: Payer: BC Managed Care – PPO | Attending: Internal Medicine

## 2019-07-11 DIAGNOSIS — Z23 Encounter for immunization: Secondary | ICD-10-CM

## 2019-07-11 NOTE — Progress Notes (Signed)
   Covid-19 Vaccination Clinic  Name:  Yana Schorr    MRN: [id number] DOB: 1956-01-28  07/11/2019  Ms. Char was observed post Covid-19 immunization for 30 minutes based on pre-vaccination screening without incident. She was provided with Vaccine Information Sheet and instruction to access the V-Safe system.   Ms. Sanz was instructed to call 911 with any severe reactions post vaccine: Marland Kitchen Difficulty breathing  . Swelling of face and throat  . A fast heartbeat  . A bad rash all over body  . Dizziness and weakness   Immunizations Administered    Name Date Dose VIS Date Route   Pfizer COVID-19 Vaccine 07/11/2019 12:31 PM 0.3 mL 04/26/2018 Intramuscular   Manufacturer: [employer], [employer]   Lot: [id number]   NDC: [address]-9

## 2019-07-17 DIAGNOSIS — M150 Primary generalized (osteo)arthritis: Secondary | ICD-10-CM | POA: Diagnosis not present

## 2019-07-17 DIAGNOSIS — M542 Cervicalgia: Secondary | ICD-10-CM | POA: Diagnosis not present

## 2019-07-17 DIAGNOSIS — R768 Other specified abnormal immunological findings in serum: Secondary | ICD-10-CM | POA: Diagnosis not present

## 2019-07-17 DIAGNOSIS — M2550 Pain in unspecified joint: Secondary | ICD-10-CM | POA: Diagnosis not present

## 2019-10-26 DIAGNOSIS — J0110 Acute frontal sinusitis, unspecified: Secondary | ICD-10-CM | POA: Diagnosis not present

## 2019-10-26 DIAGNOSIS — J309 Allergic rhinitis, unspecified: Secondary | ICD-10-CM | POA: Diagnosis not present

## 2019-10-30 ENCOUNTER — Other Ambulatory Visit: Payer: Self-pay | Admitting: Physical Medicine and Rehabilitation

## 2019-10-30 ENCOUNTER — Other Ambulatory Visit: Payer: Self-pay | Admitting: Obstetrics & Gynecology

## 2019-10-30 DIAGNOSIS — Z1231 Encounter for screening mammogram for malignant neoplasm of breast: Secondary | ICD-10-CM

## 2019-12-26 DIAGNOSIS — F411 Generalized anxiety disorder: Secondary | ICD-10-CM | POA: Diagnosis not present

## 2019-12-26 DIAGNOSIS — I10 Essential (primary) hypertension: Secondary | ICD-10-CM | POA: Diagnosis not present

## 2019-12-26 DIAGNOSIS — J4520 Mild intermittent asthma, uncomplicated: Secondary | ICD-10-CM | POA: Diagnosis not present

## 2019-12-26 DIAGNOSIS — R350 Frequency of micturition: Secondary | ICD-10-CM | POA: Diagnosis not present

## 2019-12-26 DIAGNOSIS — Z23 Encounter for immunization: Secondary | ICD-10-CM | POA: Diagnosis not present

## 2020-01-16 ENCOUNTER — Ambulatory Visit: Payer: BLUE CROSS/BLUE SHIELD | Admitting: Obstetrics & Gynecology

## 2020-01-17 ENCOUNTER — Ambulatory Visit
Admission: RE | Admit: 2020-01-17 | Discharge: 2020-01-17 | Disposition: A | Discharge status: Home / Self Care | Payer: BC Managed Care – PPO | Source: Ambulatory Visit | Attending: Obstetrics & Gynecology | Admitting: Obstetrics & Gynecology

## 2020-01-17 ENCOUNTER — Other Ambulatory Visit: Payer: Self-pay

## 2020-01-17 DIAGNOSIS — Z1231 Encounter for screening mammogram for malignant neoplasm of breast: Secondary | ICD-10-CM | POA: Diagnosis not present

## 2020-01-18 ENCOUNTER — Ambulatory Visit: Payer: BC Managed Care – PPO

## 2020-01-19 NOTE — Progress Notes (Signed)
64 y.o. G15P2012 Married White or Caucasian female here for annual exam.   Daughter, age 42, diagnosed with breast cancer, stage 3, just had surgery to remove uterus, ovaries yesterday.  Over the past year has taken more of active role assisting with grandchildren aged 30 and 63.  Overall, she feels good, no health concerns for herself.  Patient's last menstrual period was 03/03/2007 (approximate).          Sexually active: Yes.    The current method of family planning is post menopausal status.    Exercising: Yes.    walking, exercise bike Smoker:  no  Health Maintenance: Pap:  09-24-15 neg HPV HR neg History of abnormal Pap:  no MMG:  01-17-2020 category b density birads 1:neg Colonoscopy: 10/19 polyps f/u 29yrs, [facility] BMD:   01-04-16 TDaP:  2014 Gardasil:   n/a Covid-19: done pfizer Pneumonia vaccine(s):  Done with PCP Shingrix:   completed Hep C testing: done by pcp Screening Labs: brought in labs from  pcp (scanned)   reports that she has never smoked. She has never used smokeless tobacco. She reports that she does not drink alcohol and does not use drugs.  Past Medical History:  Diagnosis Date  . Asthma   . Hypertension   . Polycystic kidney disease    followed at [facility], stage II PKD  . Pruritus ani   . PVC (premature ventricular contraction)   . Renal stone   . Spinal stenosis     Past Surgical History:  Procedure Laterality Date  . AUGMENTATION MAMMAPLASTY Bilateral   . BREAST ENHANCEMENT SURGERY  1999  . DILATION AND CURETTAGE OF UTERUS  1992   missed AB  . TUBAL LIGATION Bilateral 1993    Current Outpatient Medications  Medication Sig Dispense Refill  . amLODipine (NORVASC) 5 MG tablet Take 5 mg by mouth daily.     . calcium carbonate (OS-CAL) 600 MG TABS Take 600 mg by mouth daily.      . cetirizine (ZYRTEC) 10 MG tablet Take 10 mg by mouth daily.      . Cholecalciferol (D3 HIGH POTENCY) 50 MCG (2000 UT) CAPS Take by mouth.    Marland Kitchen LORazepam  (ATIVAN) 0.5 MG tablet Take 0.5 mg by mouth as needed.    . montelukast (SINGULAIR) 10 MG tablet Take 10 mg by mouth daily.  0  . Multiple Vitamin (MULTIVITAMIN) capsule Take 1 capsule by mouth daily.      Marland Kitchen PROAIR HFA 108 (90 BASE) MCG/ACT inhaler as needed.     . SYMBICORT 160-4.5 MCG/ACT inhaler Inhale 2 puffs into the lungs daily.     Marland Kitchen triamcinolone (NASACORT) 55 MCG/ACT AERO nasal inhaler 2 sprays daily as needed. nasal     No current facility-administered medications for this visit.    Family History  Problem Relation Age of Onset  . Hypertension Mother   . Hyperlipidemia Mother   . Stroke Mother   . Hypertension Father   . Cancer Maternal Aunt        breast  . Breast cancer Maternal Aunt   . Breast cancer Daughter        stage 3    Review of Systems  Constitutional: Negative.   HENT: Negative.   Eyes: Negative.   Respiratory: Negative.   Cardiovascular: Negative.   Gastrointestinal: Negative.   Endocrine: Negative.   Genitourinary: Negative.   Musculoskeletal: Negative.   Skin: Negative.   Allergic/Immunologic: Negative.   Neurological: Negative.   Hematological:  Negative.   Psychiatric/Behavioral: Negative.     Exam:   BP 110/80   Pulse 70   Resp 16   Ht 5' 0.75" (1.543 m)   Wt 148 lb (67.1 kg)   LMP 03/03/2007 (Approximate)   BMI 28.19 kg/m   Height: 5' 0.75" (154.3 cm)  General appearance: alert, cooperative and appears stated age Head: Normocephalic, without obvious abnormality, atraumatic Neck: no adenopathy, supple, symmetrical, trachea midline and thyroid normal to inspection and palpation Lungs: clear to auscultation bilaterally Breasts: normal appearance, no masses or tenderness, implants Heart: regular rate and rhythm Abdomen: Right mid/upper quadrant with firm, non tender mass, possible transverse colon Extremities: extremities normal, atraumatic, no cyanosis or edema Skin: Skin color, texture, turgor normal. No rashes or lesions Lymph  nodes: Cervical, supraclavicular, and axillary nodes normal. No abnormal inguinal nodes palpated Neurologic: Grossly normal   Pelvic: External genitalia:  no lesions              Urethra:  normal appearing urethra with no masses, tenderness or lesions              Bartholins and Skenes: normal                 Vagina: normal appearing vagina appropriate for age, normal appearing discharge              Cervix: no cervical motion tenderness and no lesions              Pap taken: No. Bimanual Exam:  Uterus:  normal size, contour, position, consistency, mobility, non-tender              Adnexa: no mass, fullness, tenderness               Rectovaginal: Confirms               Anus:  normal sphincter tone, no lesions  Carollee Herter, CMA Chaperone was present for exam.  A:  Well Woman with normal exam  Abdominal mass/fullness  P:   Mammogram due 1 year  pap smear, co-testing due 2022  F/U 2 weeks, Use Miralax for 2-3 days prior to appointment, reassess abdominal fullness/mass. If still palpable will schedule abdominal US

## 2020-01-23 ENCOUNTER — Ambulatory Visit: Payer: BC Managed Care – PPO | Admitting: Nurse Practitioner

## 2020-01-23 ENCOUNTER — Other Ambulatory Visit: Payer: Self-pay | Admitting: Nurse Practitioner

## 2020-01-23 ENCOUNTER — Encounter: Payer: Self-pay | Admitting: Nurse Practitioner

## 2020-01-23 ENCOUNTER — Other Ambulatory Visit: Payer: Self-pay

## 2020-01-23 VITALS — BP 110/80 | HR 70 | Resp 16 | Ht 60.75 in | Wt 148.0 lb

## 2020-01-23 DIAGNOSIS — N898 Other specified noninflammatory disorders of vagina: Secondary | ICD-10-CM | POA: Diagnosis not present

## 2020-01-23 DIAGNOSIS — R198 Other specified symptoms and signs involving the digestive system and abdomen: Secondary | ICD-10-CM

## 2020-01-23 DIAGNOSIS — N952 Postmenopausal atrophic vaginitis: Secondary | ICD-10-CM

## 2020-01-23 DIAGNOSIS — Z01419 Encounter for gynecological examination (general) (routine) without abnormal findings: Secondary | ICD-10-CM | POA: Diagnosis not present

## 2020-01-23 NOTE — Progress Notes (Signed)
Estradiol vaginal cream 0.2%, use 1/2 ml intravaginally twice weekly, disp: 12 ml (3 month supply) RF 1 Called to Pharmacist at [facility], [place]

## 2020-01-23 NOTE — Patient Instructions (Addendum)
Nice to meet you today. Return in about 2 weeks to re-assess your abdomen. Please take Miralax for 2-3 days prior to your appointment.  Your prescription for vaginal estrogen was called to [facility] [phone]   Atrophic Vaginitis  Atrophic vaginitis is a condition in which the tissues that line the vagina become dry and thin. This condition is most common in women who have stopped having regular menstrual periods (are in menopause). This usually starts when a woman is 40-15 years old. That is the time when a woman's estrogen levels begin to drop (decrease). Estrogen is a female hormone. It helps to keep the tissues of the vagina moist. It stimulates the vagina to produce a clear fluid that lubricates the vagina for sexual intercourse. This fluid also protects the vagina from infection. Lack of estrogen can cause the lining of the vagina to get thinner and dryer. The vagina may also shrink in size. It may become less elastic. Atrophic vaginitis tends to get worse over time as a woman's estrogen level drops. What are the causes? This condition is caused by the normal drop in estrogen that happens around the time of menopause. What increases the risk? Certain conditions or situations may lower a woman's estrogen level, leading to a higher risk for atrophic vaginitis. You are more likely to develop this condition if:  You are taking medicines that block estrogen.  You have had your ovaries removed.  You are being treated for cancer with X-ray (radiation) or medicines (chemotherapy).  You have given birth or are breastfeeding.  You are older than age 17.  You smoke. What are the signs or symptoms? Symptoms of this condition include:  Pain, soreness, or bleeding during sexual intercourse (dyspareunia).  Vaginal burning, irritation, or itching.  Pain or bleeding when a speculum is used in a vaginal exam (pelvic exam).  Having burning pain when passing urine.  Vaginal  discharge that is brown or yellow. In some cases, there are no symptoms. How is this diagnosed? This condition is diagnosed by taking a medical history and doing a physical exam. This will include a pelvic exam that checks the vaginal tissues. Though rare, you may also have other tests, including:  A urine test.  A test that checks the acid balance in your vagina (acid balance test). How is this treated? Treatment for this condition depends on how severe your symptoms are. Treatment may include:  Using an over-the-counter vaginal lubricant before sex.  Using a long-acting vaginal moisturizer.  Using low-dose vaginal estrogen for moderate to severe symptoms that do not respond to other treatments. Options include creams, tablets, and inserts (vaginal rings). Before you use a vaginal estrogen, tell your health care provider if you have a history of: ? Breast cancer. ? Endometrial cancer. ? Blood clots. If you are not sexually active and your symptoms are very mild, you may not need treatment. Follow these instructions at home: Medicines  Take over-the-counter and prescription medicines only as told by your health care provider. Do not use herbal or alternative medicines unless your health care provider says that you can.  Use over-the-counter creams, lubricants, or moisturizers for dryness only as directed by your health care provider. General instructions  If your atrophic vaginitis is caused by menopause, discuss all of your menopause symptoms and treatment options with your health care provider.  Do not douche.  Do not use products that can make your vagina dry. These include: ? Scented feminine sprays. ? Scented tampons. ?  Scented soaps.  Vaginal intercourse can help to improve blood flow and elasticity of vaginal tissue. If it hurts to have sex, try using a lubricant or moisturizer just before having intercourse. Contact a health care provider if:  Your discharge looks  different than normal.  Your vagina has an unusual smell.  You have new symptoms.  Your symptoms do not improve with treatment.  Your symptoms get worse. Summary  Atrophic vaginitis is a condition in which the tissues that line the vagina become dry and thin. It is most common in women who have stopped having regular menstrual periods (are in menopause).  Treatment options include using vaginal lubricants and low-dose vaginal estrogen.  Contact a health care provider if your vagina has an unusual smell, or if your symptoms get worse or do not improve after treatment. This information is not intended to replace advice given to you by your health care provider. Make sure you discuss any questions you have with your health care provider. Document Revised: 01/29/2017 Document Reviewed: 11/12/2016 Elsevier Patient Education  2020 [employer].    Health Maintenance for Postmenopausal Women Menopause is a normal process in which your ability to get pregnant comes to an end. This process happens slowly over many months or years, usually between the ages of 55 and 22. Menopause is complete when you have missed your menstrual periods for 12 months. It is important to talk with your health care provider about some of the most common conditions that affect women after menopause (postmenopausal women). These include heart disease, cancer, and bone loss (osteoporosis). Adopting a healthy lifestyle and getting preventive care can help to promote your health and wellness. The actions you take can also lower your chances of developing some of these common conditions. What should I know about menopause? During menopause, you may get a number of symptoms, such as:  Hot flashes. These can be moderate or severe.  Night sweats.  Decrease in sex drive.  Mood swings.  Headaches.  Tiredness.  Irritability.  Memory problems.  Insomnia. Choosing to treat or not to treat these symptoms is a  decision that you make with your health care provider. Do I need hormone replacement therapy?  Hormone replacement therapy is effective in treating symptoms that are caused by menopause, such as hot flashes and night sweats.  Hormone replacement carries certain risks, especially as you become older. If you are thinking about using estrogen or estrogen with progestin, discuss the benefits and risks with your health care provider. What is my risk for heart disease and stroke? The risk of heart disease, heart attack, and stroke increases as you age. One of the causes may be a change in the body's hormones during menopause. This can affect how your body uses dietary fats, triglycerides, and cholesterol. Heart attack and stroke are medical emergencies. There are many things that you can do to help prevent heart disease and stroke. Watch your blood pressure  High blood pressure causes heart disease and increases the risk of stroke. This is more likely to develop in people who have high blood pressure readings, are of African descent, or are overweight.  Have your blood pressure checked: ? Every 3-5 years if you are 65-82 years of age. ? Every year if you are 84 years old or older. Eat a healthy diet   Eat a diet that includes plenty of vegetables, fruits, low-fat dairy products, and lean protein.  Do not eat a lot of foods that are high in  solid fats, added sugars, or sodium. Get regular exercise Get regular exercise. This is one of the most important things you can do for your health. Most adults should:  Try to exercise for at least 150 minutes each week. The exercise should increase your heart rate and make you sweat (moderate-intensity exercise).  Try to do strengthening exercises at least twice each week. Do these in addition to the moderate-intensity exercise.  Spend less time sitting. Even light physical activity can be beneficial. Other tips  Work with your health care provider to  achieve or maintain a healthy weight.  Do not use any products that contain nicotine or tobacco, such as cigarettes, e-cigarettes, and chewing tobacco. If you need help quitting, ask your health care provider.  Know your numbers. Ask your health care provider to check your cholesterol and your blood sugar (glucose). Continue to have your blood tested as directed by your health care provider. Do I need screening for cancer? Depending on your health history and family history, you may need to have cancer screening at different stages of your life. This may include screening for:  Breast cancer.  Cervical cancer.  Lung cancer.  Colorectal cancer. What is my risk for osteoporosis? After menopause, you may be at increased risk for osteoporosis. Osteoporosis is a condition in which bone destruction happens more quickly than new bone creation. To help prevent osteoporosis or the bone fractures that can happen because of osteoporosis, you may take the following actions:  If you are 33-2 years old, get at least 1,000 mg of calcium and at least 600 mg of vitamin D per day.  If you are older than age 65 but younger than age 77, get at least 1,200 mg of calcium and at least 600 mg of vitamin D per day.  If you are older than age 63, get at least 1,200 mg of calcium and at least 800 mg of vitamin D per day. Smoking and drinking excessive alcohol increase the risk of osteoporosis. Eat foods that are rich in calcium and vitamin D, and do weight-bearing exercises several times each week as directed by your health care provider. How does menopause affect my mental health? Depression may occur at any age, but it is more common as you become older. Common symptoms of depression include:  Low or sad mood.  Changes in sleep patterns.  Changes in appetite or eating patterns.  Feeling an overall lack of motivation or enjoyment of activities that you previously enjoyed.  Frequent crying spells. Talk  with your health care provider if you think that you are experiencing depression. General instructions See your health care provider for regular wellness exams and vaccines. This may include:  Scheduling regular health, dental, and eye exams.  Getting and maintaining your vaccines. These include: ? Influenza vaccine. Get this vaccine each year before the flu season begins. ? Pneumonia vaccine. ? Shingles vaccine. ? Tetanus, diphtheria, and pertussis (Tdap) booster vaccine. Your health care provider may also recommend other immunizations. Tell your health care provider if you have ever been abused or do not feel safe at home. Summary  Menopause is a normal process in which your ability to get pregnant comes to an end.  This condition causes hot flashes, night sweats, decreased interest in sex, mood swings, headaches, or lack of sleep.  Treatment for this condition may include hormone replacement therapy.  Take actions to keep yourself healthy, including exercising regularly, eating a healthy diet, watching your weight, and checking your  blood pressure and blood sugar levels.  Get screened for cancer and depression. Make sure that you are up to date with all your vaccines. This information is not intended to replace advice given to you by your health care provider. Make sure you discuss any questions you have with your health care provider. Document Revised: 02/09/2018 Document Reviewed: 02/09/2018 Elsevier Patient Education  2020 [employer]

## 2020-02-12 NOTE — Progress Notes (Signed)
GYNECOLOGY  VISIT  CC:  Abdominal Mass palpable with exam 2 weeks ago  HPI: 64 y.o. [id number] Married White or Caucasian female here for 2 week f/u of abdominal mass/fullness, patient to use miralax for 2-3 days before appt.  Normal BMs, denies pain or nausea, no other symptoms  (reports daughter with breast cancer doing well after hyst/ oophorectomy surgery)    GYNECOLOGIC HISTORY: Patient's last menstrual period was 03/03/2007 (approximate). Contraception: post menopausal Menopausal hormone therapy: estradiol vaginal tablet  Patient Active Problem List   Diagnosis Date Noted  . Adult polycystic kidney disease 06/26/2014  . UNSPECIFIED TACHYCARDIA 11/19/2008  . PALPITATIONS 11/19/2008  . WRIST PAIN 12/19/2007  . HIP PAIN 12/19/2007  . HEADACHE 06/16/2007  . HYPERTENSION 12/27/2006  . ASTHMA 12/27/2006  . NEUROPATHY, OTHER INFLAMMATORY AND TOXIC 09/22/2006    Past Medical History:  Diagnosis Date  . Asthma   . Hypertension   . Polycystic kidney disease    followed at [facility], stage II PKD  . Pruritus ani   . PVC (premature ventricular contraction)   . Renal stone   . Spinal stenosis     Past Surgical History:  Procedure Laterality Date  . AUGMENTATION MAMMAPLASTY Bilateral   . BREAST ENHANCEMENT SURGERY  1999  . DILATION AND CURETTAGE OF UTERUS  1992   missed AB  . TUBAL LIGATION Bilateral 1993    MEDS:   Current Outpatient Medications on File Prior to Visit  Medication Sig Dispense Refill  . amLODipine (NORVASC) 5 MG tablet Take 5 mg by mouth daily.     . calcium carbonate (OS-CAL) 600 MG TABS Take 600 mg by mouth daily.    . cetirizine (ZYRTEC) 10 MG tablet Take 10 mg by mouth daily.    . Cholecalciferol (D3 HIGH POTENCY) 50 MCG (2000 UT) CAPS Take by mouth.    . ESTRADIOL ACETATE VA Place 0.5 mLs vaginally 2 (two) times a week.    Marland Kitchen LORazepam (ATIVAN) 0.5 MG tablet Take 0.5 mg by mouth as needed.    . montelukast (SINGULAIR) 10 MG tablet Take 10 mg  by mouth daily.  0  . Multiple Vitamin (MULTIVITAMIN) capsule Take 1 capsule by mouth daily.    Marland Kitchen PROAIR HFA 108 (90 BASE) MCG/ACT inhaler as needed.     . SYMBICORT 160-4.5 MCG/ACT inhaler Inhale 2 puffs into the lungs daily.     Marland Kitchen triamcinolone (NASACORT) 55 MCG/ACT AERO nasal inhaler 2 sprays daily as needed. nasal     No current facility-administered medications on file prior to visit.    ALLERGIES: Ace inhibitors, Cefuroxime axetil, Irbesartan, Prinivil [lisinopril], Sulfonamide derivatives, and Fexofenadine  Family History  Problem Relation Age of Onset  . Hypertension Mother   . Hyperlipidemia Mother   . Stroke Mother   . Hypertension Father   . Cancer Maternal Aunt        breast  . Breast cancer Maternal Aunt   . Breast cancer Daughter        stage 3    Review of Systems  Constitutional: Negative.   HENT: Negative.   Eyes: Negative.   Respiratory: Negative.   Cardiovascular: Negative.   Gastrointestinal: Negative.   Endocrine: Negative.   Genitourinary: Negative.   Musculoskeletal: Negative.   Skin: Negative.   Allergic/Immunologic: Negative.   Neurological: Negative.   Hematological: Negative.   Psychiatric/Behavioral: Negative.     PHYSICAL EXAMINATION:    BP 120/80   Pulse 70   Resp 16   Wt 148  lb (67.1 kg)   LMP 03/03/2007 (Approximate)   BMI 28.19 kg/m     General appearance: alert, cooperative and appears stated age  Abdomen: soft, non-tender; bowel sounds normal; persistent mass palpable RUQ, not tender, soft      Assessment: Abdominal mass RUQ  Plan: Discussed with Dr. Edward Jolly. Will order Abdominal US to determine if abnormality is cyst of kidney vs liver. Coorelate results with MRI results from 2017  Will make appropriate referral pending [place] results, GI vs f/u with kidney specialist

## 2020-02-13 ENCOUNTER — Encounter: Payer: Self-pay | Admitting: Nurse Practitioner

## 2020-02-13 ENCOUNTER — Other Ambulatory Visit: Payer: Self-pay

## 2020-02-13 ENCOUNTER — Ambulatory Visit
Admission: RE | Admit: 2020-02-13 | Discharge: 2020-02-13 | Disposition: A | Discharge status: Home / Self Care | Payer: BC Managed Care – PPO | Source: Ambulatory Visit | Attending: Nurse Practitioner | Admitting: Nurse Practitioner

## 2020-02-13 ENCOUNTER — Ambulatory Visit: Payer: BC Managed Care – PPO | Admitting: Nurse Practitioner

## 2020-02-13 ENCOUNTER — Telehealth: Payer: Self-pay | Admitting: *Deleted

## 2020-02-13 ENCOUNTER — Other Ambulatory Visit: Payer: BC Managed Care – PPO

## 2020-02-13 VITALS — BP 120/80 | HR 70 | Resp 16 | Wt 148.0 lb

## 2020-02-13 DIAGNOSIS — R1901 Right upper quadrant abdominal swelling, mass and lump: Secondary | ICD-10-CM

## 2020-02-13 DIAGNOSIS — N281 Cyst of kidney, acquired: Secondary | ICD-10-CM | POA: Diagnosis not present

## 2020-02-13 DIAGNOSIS — K7689 Other specified diseases of liver: Secondary | ICD-10-CM | POA: Diagnosis not present

## 2020-02-13 NOTE — Patient Instructions (Signed)
Abdominal or Pelvic Ultrasound An ultrasound is a test that uses sound waves to take pictures of the inside of the body. An abdominal ultrasound takes pictures of the inside of your belly (abdomen). A pelvic ultrasound takes pictures of the inside of the area between your hip bones (pelvis). An ultrasound may be done to check an organ or look for problems. This is a safe test that does not hurt. It is done by placing a handheld device called a transducer on the outside of your belly or pelvis and moving it around. Tell your doctor about:  Any allergies you have.  All medicines you are taking.  Any surgeries you have had.  Any medical conditions you have.  Whether you are pregnant or may be pregnant. What are the risks? There are no known risks from having this test. What happens before the procedure?  Follow instructions from your doctor about eating or drinking before the test.  Wear clothing that is easy to wash. Gel from the test might get on your clothes. What happens during the procedure?   You will lie on an exam table.  Your clothes will be moved so your belly and pelvis are showing.  A gel will be put on your skin. It may feel cool.  The transducer device will be put on your skin. It will be moved back and forth over the area being looked at.  The device will take pictures. They will show on small TV screens.  You may be asked to change your position.  After the exam, the gel will be cleaned off. What happens after the procedure?  It is up to you to get the results of your test. Ask your doctor, or the department that is doing the test, when your results will be ready.  Keep all follow-up visits as told by your doctor. This is important. Summary  An ultrasound is a test that uses sound waves to take pictures of the inside of the body.  An ultrasound may be done to check an organ or look for problems.  The test is done by moving a handheld device around on the  outside of your belly or pelvis.  It is up to you to get the results of your test. Be sure to ask when your results will be ready. This information is not intended to replace advice given to you by your health care provider. Make sure you discuss any questions you have with your health care provider. Document Revised: 09/13/2017 Document Reviewed: 09/13/2017 Elsevier Patient Education  2020 [employer]vier Inc.  

## 2020-02-13 NOTE — Telephone Encounter (Signed)
-----   Message from Clarita Crane, NP sent at 02/13/2020  9:17 AM EST ----- Please schedule abdominal ultrasound for this patient at [facility] imaging RUQ mass, soft, palpable today and 2 weeks ago when first palpated. Pt is assymptomatic Has known polycystic kidneys and a liver cyst was seen on MR in 2017. (Abdominal US  vs Limited vs Limited RUQ) Thanks

## 2020-02-13 NOTE — Telephone Encounter (Signed)
Call to GI, spoke with [facility] Patient scheduled for abdominal ultrasound today at 1515.   Call to patient. Patient updated of appointment time at [address] today and is agreeable. Aware to arrive at 1500.   Routing to provider and will close encounter.

## 2020-02-14 ENCOUNTER — Telehealth: Payer: Self-pay

## 2020-02-14 DIAGNOSIS — R1901 Right upper quadrant abdominal swelling, mass and lump: Secondary | ICD-10-CM

## 2020-02-14 NOTE — Telephone Encounter (Signed)
Records faxed to [facility] via Epic.

## 2020-02-14 NOTE — Telephone Encounter (Signed)
Spoke with pt. Pt given results and recommendations per Tresa Endo, NP. Pt states has been to [facility] in the past for colonoscopy. Agreeable to go back there for follow up on liver cyst. Pt will call for follow up on kidneys.  Call placed to [facility] and spoke to [place]. Pt scheduled for follow up appt on 12/16 at 130 pm with Dr Marca Ancona. Pt was seen last in 10/2017 for colonoscopy.   Call placed to pt. Pt given update on appt at [facility] tomorrow. Pt agreeable and verbalized understanding.  Cc: Hayley to send referral notes, scans Routing to Tresa Endo, NP for review and update Encounter closed

## 2020-02-14 NOTE — Telephone Encounter (Signed)
-----   Message from Clarita Crane, NP sent at 02/13/2020  5:58 PM EST ----- Pt's abdominal Ultrasound showed a 10 cm liver cyst. Her CT in 2017 showed a liver cyst at that time at 5 cm. I am not certain of the significance of this. Pt is asymptomatic, however, I would like to refer her to GI for their opinion. Please refer pt to GI provider.  Also, please as patient to follow up with her kidney doctor to evaluate any changes to her polycystic kidneys.

## 2020-02-15 DIAGNOSIS — K7689 Other specified diseases of liver: Secondary | ICD-10-CM | POA: Diagnosis not present

## 2020-02-15 DIAGNOSIS — Z86010 Personal history of colonic polyps: Secondary | ICD-10-CM | POA: Diagnosis not present

## 2020-02-15 DIAGNOSIS — Q612 Polycystic kidney, adult type: Secondary | ICD-10-CM | POA: Diagnosis not present

## 2020-02-19 ENCOUNTER — Encounter: Payer: Self-pay | Admitting: Neurology

## 2020-04-19 NOTE — Progress Notes (Signed)
NEUROLOGY CONSULTATION NOTE  [name] [name] MRN: [id number] DOB: 02-10-1956  Referring provider: Kerin Salen, MD Primary care provider: Lorenda Ishihara, MD  Reason for consult:  Screening for cerebral aneurysm.   Subjective:  [name] [name] is a 65 year old right-handed female with polycystic kidney disease and HTN who presents for screening for cerebral aneurysm.  History supplemented by referring provider's note.  Patient has autosomal dominant polycystic disease with cysts in the liver and kidneys.  Strong family history:  Mother, daughters, maternal aunt and niece have it as well.  She denies known family history of cerebral aneurysm.    PAST MEDICAL HISTORY: Past Medical History:  Diagnosis Date  . Asthma   . Hypertension   . Polycystic kidney disease    followed at [facility], stage II PKD  . Pruritus ani   . PVC (premature ventricular contraction)   . Renal stone   . Spinal stenosis     PAST SURGICAL HISTORY: Past Surgical History:  Procedure Laterality Date  . AUGMENTATION MAMMAPLASTY Bilateral   . BREAST ENHANCEMENT SURGERY  1999  . DILATION AND CURETTAGE OF UTERUS  1992   missed AB  . TUBAL LIGATION Bilateral 1993    MEDICATIONS: Current Outpatient Medications on File Prior to Visit  Medication Sig Dispense Refill  . amLODipine (NORVASC) 5 MG tablet Take 5 mg by mouth daily.     . calcium carbonate (OS-CAL) 600 MG TABS Take 600 mg by mouth daily.    . cetirizine (ZYRTEC) 10 MG tablet Take 10 mg by mouth daily.    . Cholecalciferol (D3 HIGH POTENCY) 50 MCG (2000 UT) CAPS Take by mouth.    . ESTRADIOL ACETATE VA Place 0.5 mLs vaginally 2 (two) times a week.    Marland Kitchen LORazepam (ATIVAN) 0.5 MG tablet Take 0.5 mg by mouth as needed.    . montelukast (SINGULAIR) 10 MG tablet Take 10 mg by mouth daily.  0  . Multiple Vitamin (MULTIVITAMIN) capsule Take 1 capsule by mouth daily.    Marland Kitchen PROAIR HFA 108 (90 BASE) MCG/ACT inhaler as needed.     . SYMBICORT  160-4.5 MCG/ACT inhaler Inhale 2 puffs into the lungs daily.     Marland Kitchen triamcinolone (NASACORT) 55 MCG/ACT AERO nasal inhaler 2 sprays daily as needed. nasal     No current facility-administered medications on file prior to visit.    ALLERGIES: Allergies  Allergen Reactions  . Ace Inhibitors     REACTION: angio edema  . Cefuroxime Axetil   . Irbesartan     REACTION: papitations  . Prinivil [Lisinopril] Other (See Comments)    angioedema angioedema  . Sulfonamide Derivatives     REACTION: Rash  . Fexofenadine Palpitations     And insomnia  And insomnia    FAMILY HISTORY: Family History  Problem Relation Age of Onset  . Hypertension Mother   . Hyperlipidemia Mother   . Stroke Mother   . Hypertension Father   . Cancer Maternal Aunt        breast  . Breast cancer Maternal Aunt   . Breast cancer Daughter        stage 3    SOCIAL HISTORY: Social History   Socioeconomic History  . Marital status: Married    Spouse name: Not on file  . Number of children: 2  . Years of education: Not on file  . Highest education level: Not on file  Occupational History  . 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 Smoking status: Never Smoker  . Smokeless tobacco: Never Used  Vaping Use  . Vaping Use: Never used  Substance and Sexual Activity  . Alcohol use: No  . Drug use: No  . Sexual activity: Yes    Partners: Male    Birth control/protection: Post-menopausal  Other Topics Concern  . Not on file  Social History Narrative  . Not on file   Social Determinants of Health   Financial Resource Strain: Not on file  Food Insecurity: Not on file  Transportation Needs: Not on file  Physical Activity: Not on file  Stress: Not on file  Social Connections: Not on file  Intimate Partner Violence: Not on file    Objective:  Blood pressure (!) 177/92, pulse 83, height 5\' 1"  (1.549 m), weight 153 lb (69.4 kg), last menstrual period  03/03/2007, SpO2 99 %. General: No acute distress.  Patient appears well-groomed.   Head:  Normocephalic/atraumatic Eyes:  fundi examined but not visualized Neck: supple, no paraspinal tenderness, full range of motion Back: No paraspinal tenderness Heart: regular rate and rhythm Lungs: Clear to auscultation bilaterally. Vascular: No carotid bruits. Neurological Exam: Mental status: alert and oriented to person, place, and time, recent and remote memory intact, fund of knowledge intact, attention and concentration intact, speech fluent and not dysarthric, language intact. Cranial nerves: CN I: not tested CN II: pupils equal, round and reactive to light, visual fields intact CN III, IV, VI:  full range of motion, no nystagmus, no ptosis CN V: facial sensation intact. CN VII: upper and lower face symmetric CN VIII: hearing intact CN IX, X: gag intact, uvula midline CN XI: sternocleidomastoid and trapezius muscles intact CN XII: tongue midline Bulk & Tone: normal, no fasciculations. Motor:  muscle strength 5/5 throughout Sensation:  Pinprick, temperature and vibratory sensation intact. Deep Tendon Reflexes:  2+ throughout,  toes downgoing.   Finger to nose testing:  Without dysmetria.   Heel to shin:  Without dysmetria.   Gait:  Normal station and stride.  Romberg negative.  Assessment/Plan:   Patient with autosomal dominant polycystic disease presents for screening for cerebral aneurysm.  1.  Check MRA of head 2.  Further recommendation pending results. 3.  Follow up blood pressure with PCP    Thank you for allowing me to take part in the care of this patient.  05/01/2007, DO  CC:  Shon Millet, MD  Kerin Salen, MD

## 2020-04-22 ENCOUNTER — Ambulatory Visit: Payer: BC Managed Care – PPO | Admitting: Neurology

## 2020-04-22 ENCOUNTER — Encounter: Payer: Self-pay | Admitting: Neurology

## 2020-04-22 ENCOUNTER — Other Ambulatory Visit: Payer: Self-pay

## 2020-04-22 VITALS — BP 177/92 | HR 83 | Ht 61.0 in | Wt 153.0 lb

## 2020-04-22 DIAGNOSIS — Q612 Polycystic kidney, adult type: Secondary | ICD-10-CM | POA: Diagnosis not present

## 2020-04-22 DIAGNOSIS — I10 Essential (primary) hypertension: Secondary | ICD-10-CM

## 2020-04-22 NOTE — Patient Instructions (Addendum)
1.  We will check MRA of head.We have sent a referral to [facility] for your MRA and they will call you directly to schedule your appointment. They are located at [address] [facility] If you need to contact them directly please call [phone].  2.  Further recommendations pending results.

## 2020-04-29 ENCOUNTER — Other Ambulatory Visit: Payer: BC Managed Care – PPO

## 2020-05-20 DIAGNOSIS — N182 Chronic kidney disease, stage 2 (mild): Secondary | ICD-10-CM | POA: Diagnosis not present

## 2020-05-20 DIAGNOSIS — I129 Hypertensive chronic kidney disease with stage 1 through stage 4 chronic kidney disease, or unspecified chronic kidney disease: Secondary | ICD-10-CM | POA: Diagnosis not present

## 2020-05-20 DIAGNOSIS — Q612 Polycystic kidney, adult type: Secondary | ICD-10-CM | POA: Diagnosis not present

## 2020-05-20 DIAGNOSIS — Z87442 Personal history of urinary calculi: Secondary | ICD-10-CM | POA: Diagnosis not present

## 2020-07-09 DIAGNOSIS — J4520 Mild intermittent asthma, uncomplicated: Secondary | ICD-10-CM | POA: Diagnosis not present

## 2020-07-09 DIAGNOSIS — I10 Essential (primary) hypertension: Secondary | ICD-10-CM | POA: Diagnosis not present

## 2020-07-09 DIAGNOSIS — F411 Generalized anxiety disorder: Secondary | ICD-10-CM | POA: Diagnosis not present

## 2020-07-09 DIAGNOSIS — J309 Allergic rhinitis, unspecified: Secondary | ICD-10-CM | POA: Diagnosis not present

## 2020-09-14 IMAGING — CR DG HAND COMPLETE 3+V*R*
3 series · 3 of 3 positions shown · non-contrast
Comparison: None.

CLINICAL DATA: Chronic bilateral hand pain.  Pain in both thumbs.

EXAM:
RIGHT HAND - COMPLETE 3+ VIEW

[x hand pa right]
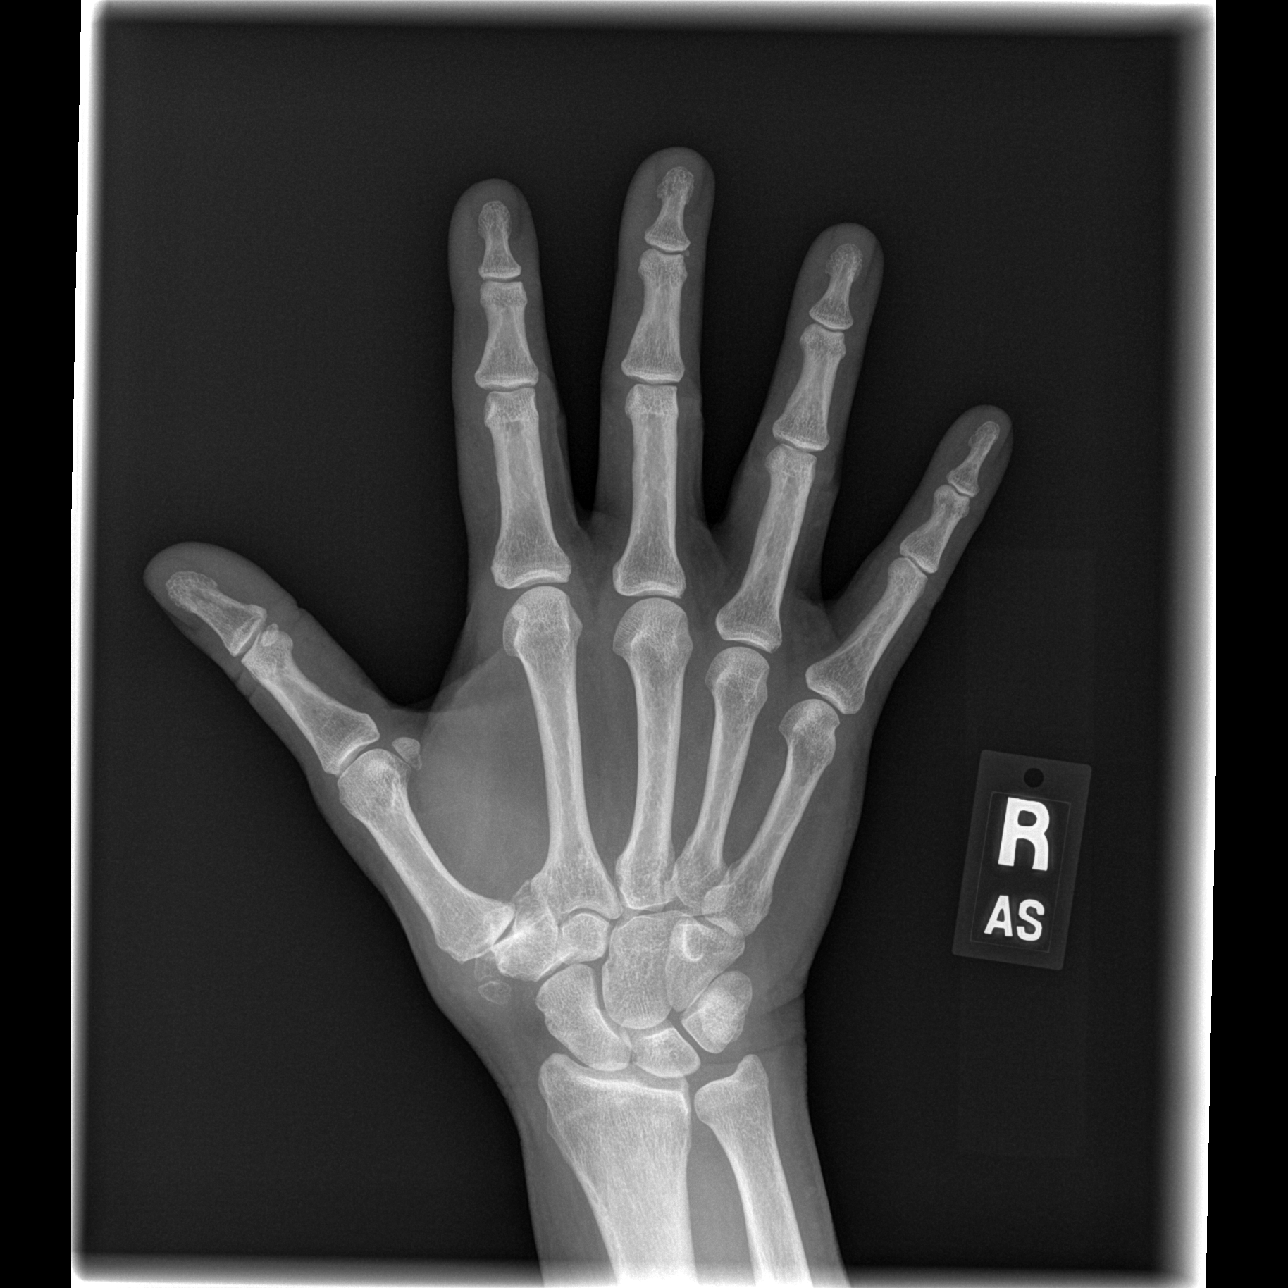

[x hand oblique right]
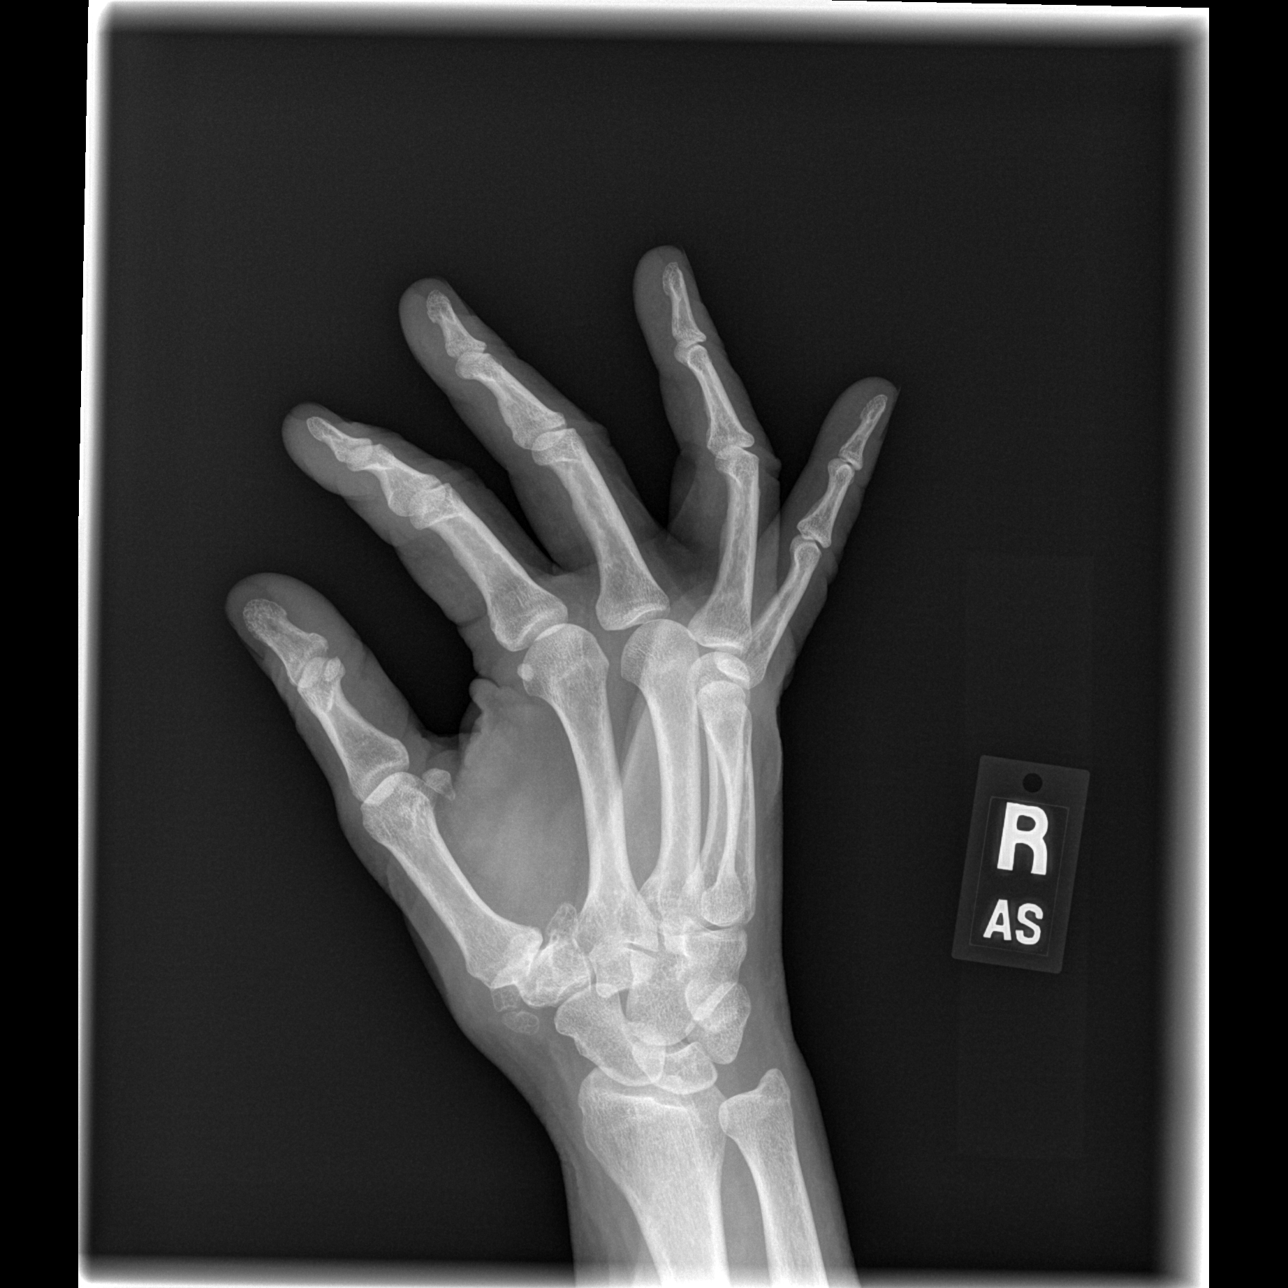

[x hand lat right]
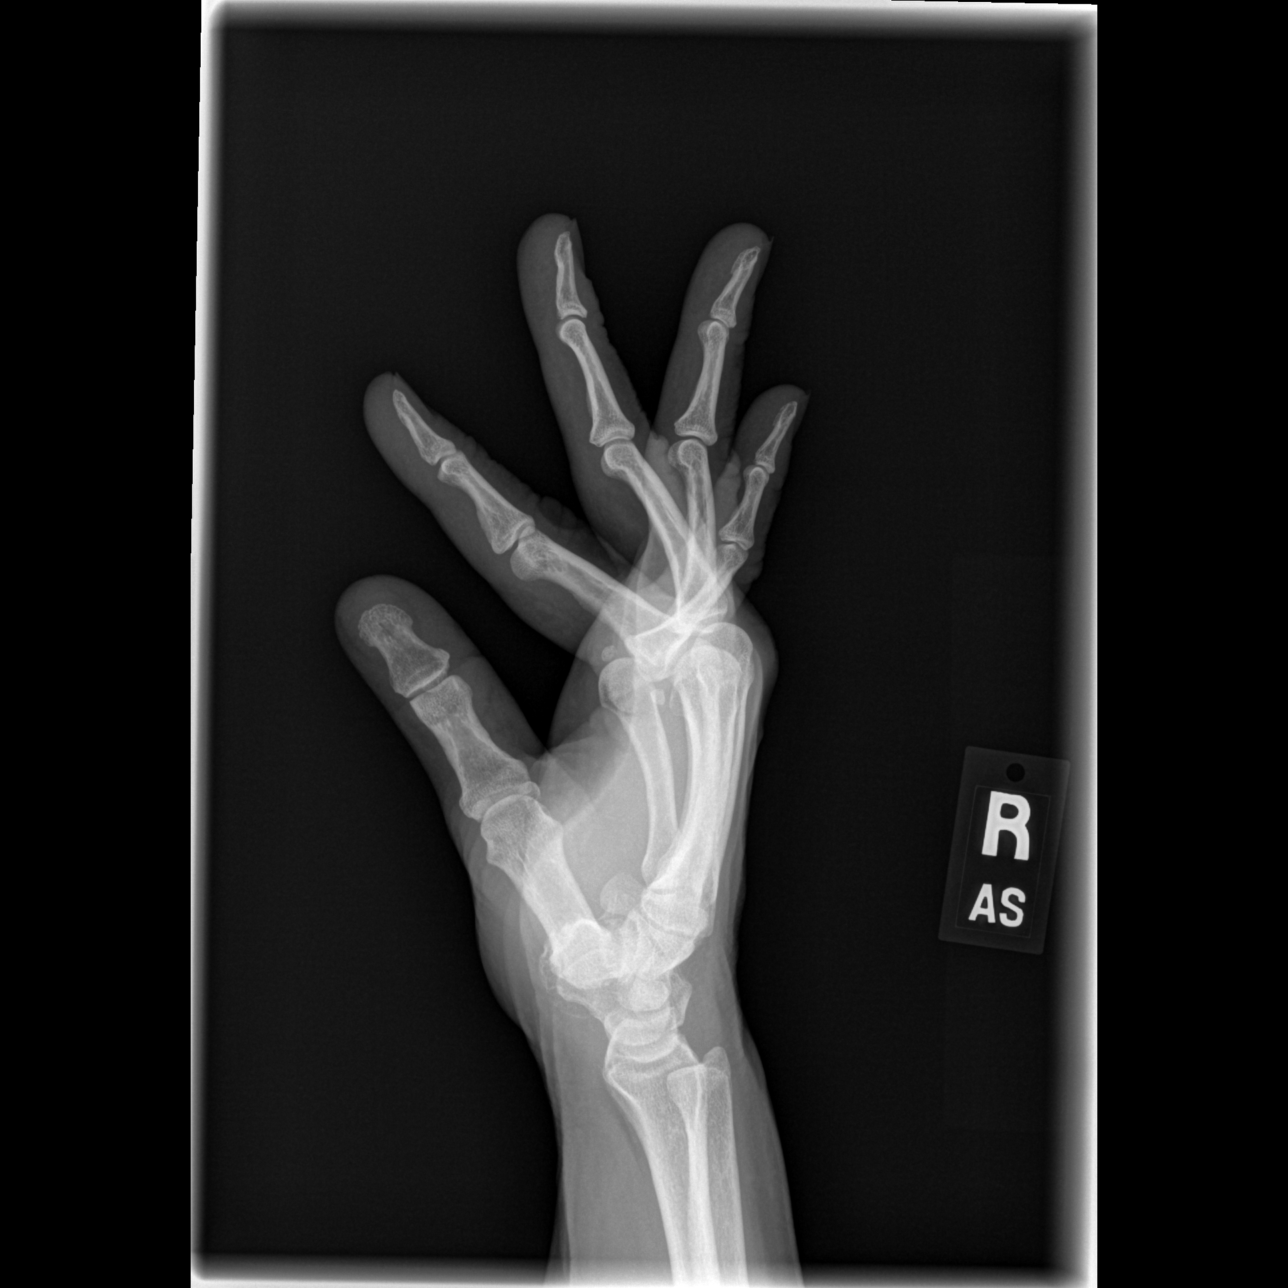

[3 of 3 positions shown; findings below may reference images not displayed]

FINDINGS: No fracture or bone lesion.

Mild narrowing of the trapezium first metacarpal articulation. There
are adjacent well corticated bone fragments.

Remaining joints are normally spaced and aligned.

Soft tissues are unremarkable.
IMPRESSION: 1. Mild degenerative changes at the first carpal metacarpal
articulation. Small adjacent well corticated bone fragments are
likely accessory ossification centers. Spurring is noted from the
margin of the trapezium.
2. No other abnormalities.  No fracture or acute finding.

## 2020-09-14 IMAGING — CR DG CERVICAL SPINE COMPLETE 4+V
5 series · 5 of 5 positions shown · non-contrast
Comparison: None.

CLINICAL DATA: Cervicalgia with radicular symptoms

EXAM:
CERVICAL SPINE - COMPLETE 4+ VIEW

[w c-spine lat]
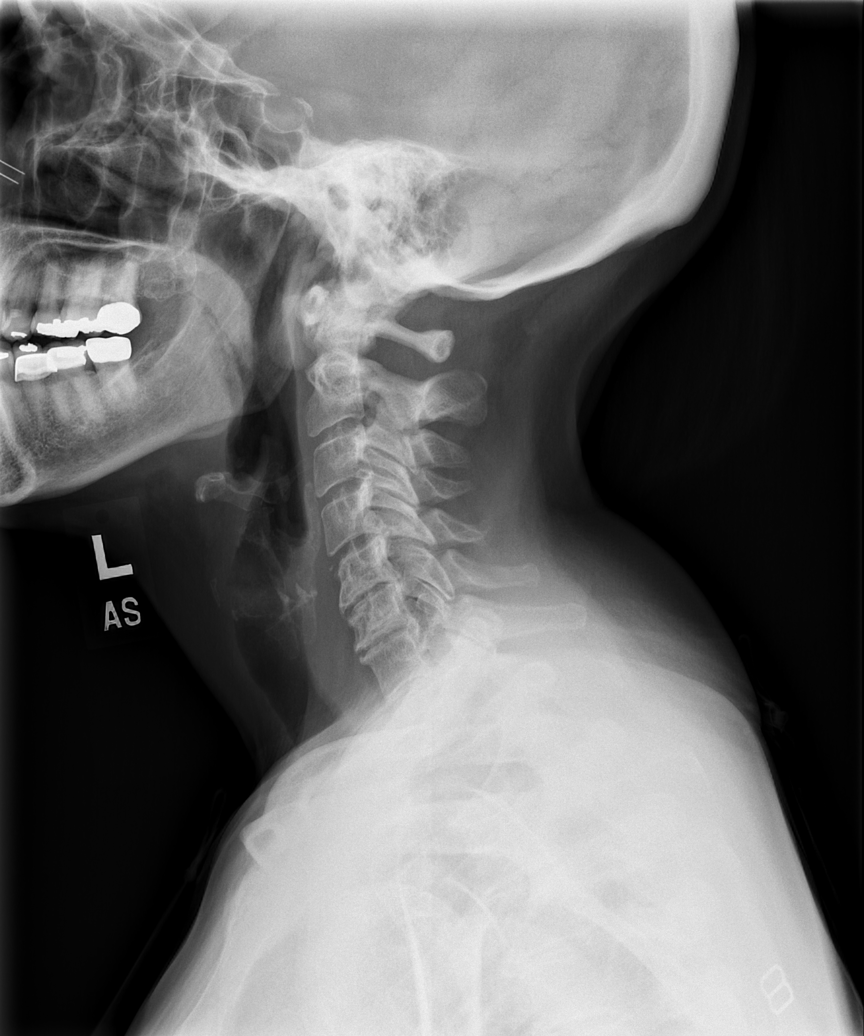

[w c-spine oblique (1 of 2)]
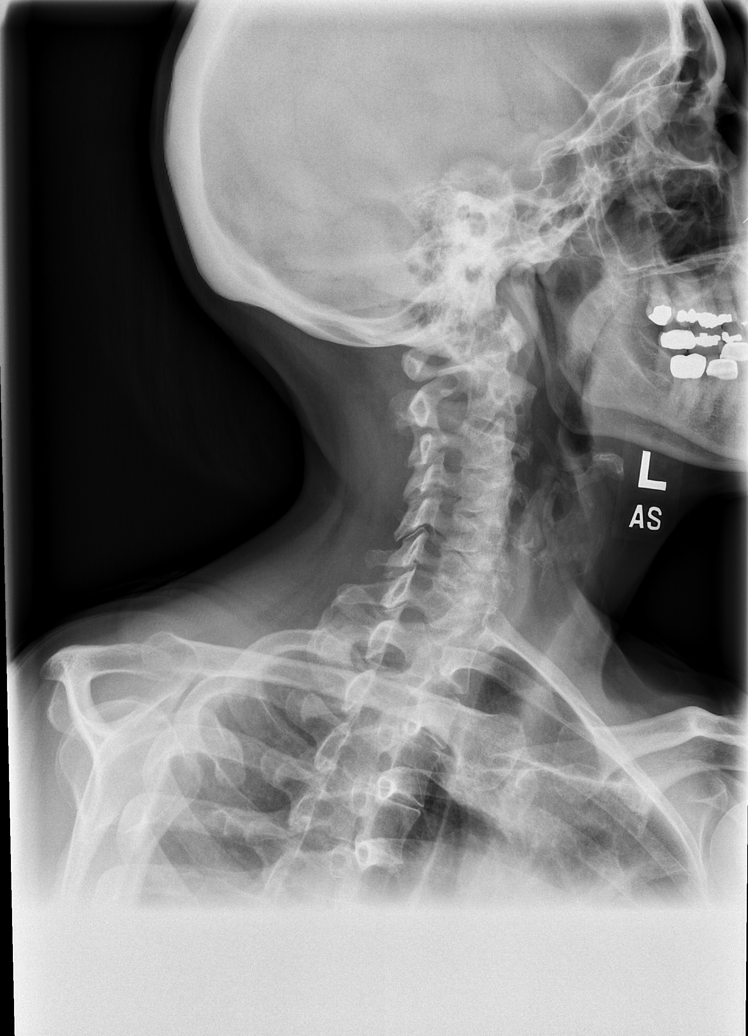

[w c-spine oblique (2 of 2)]
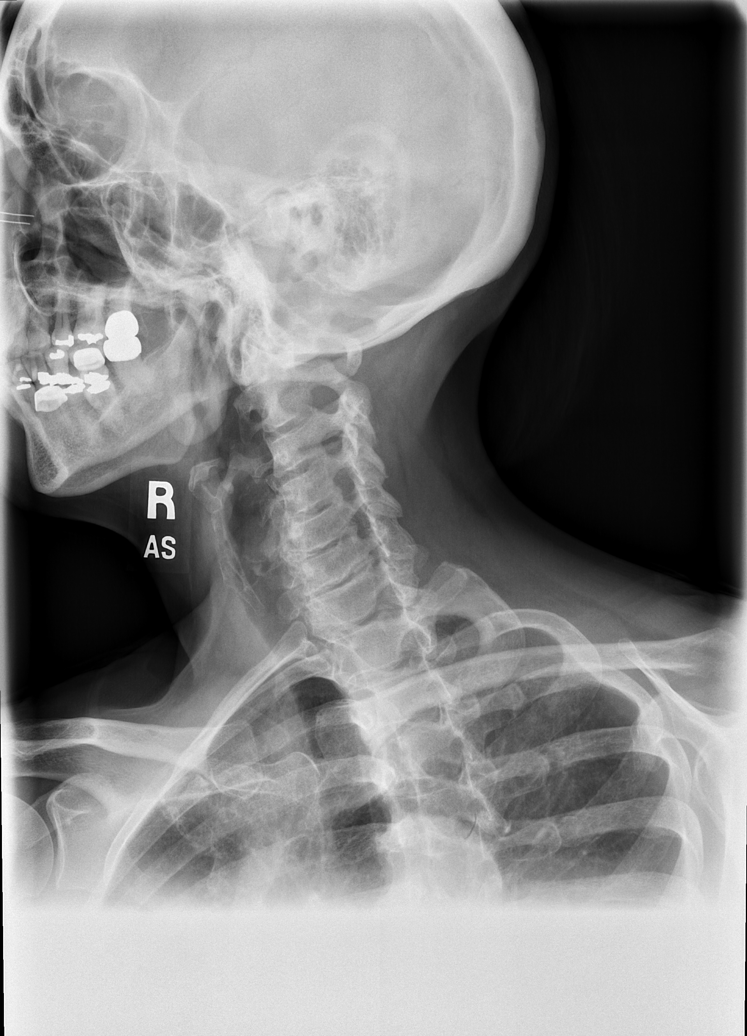

[w c-spine a.p. *]
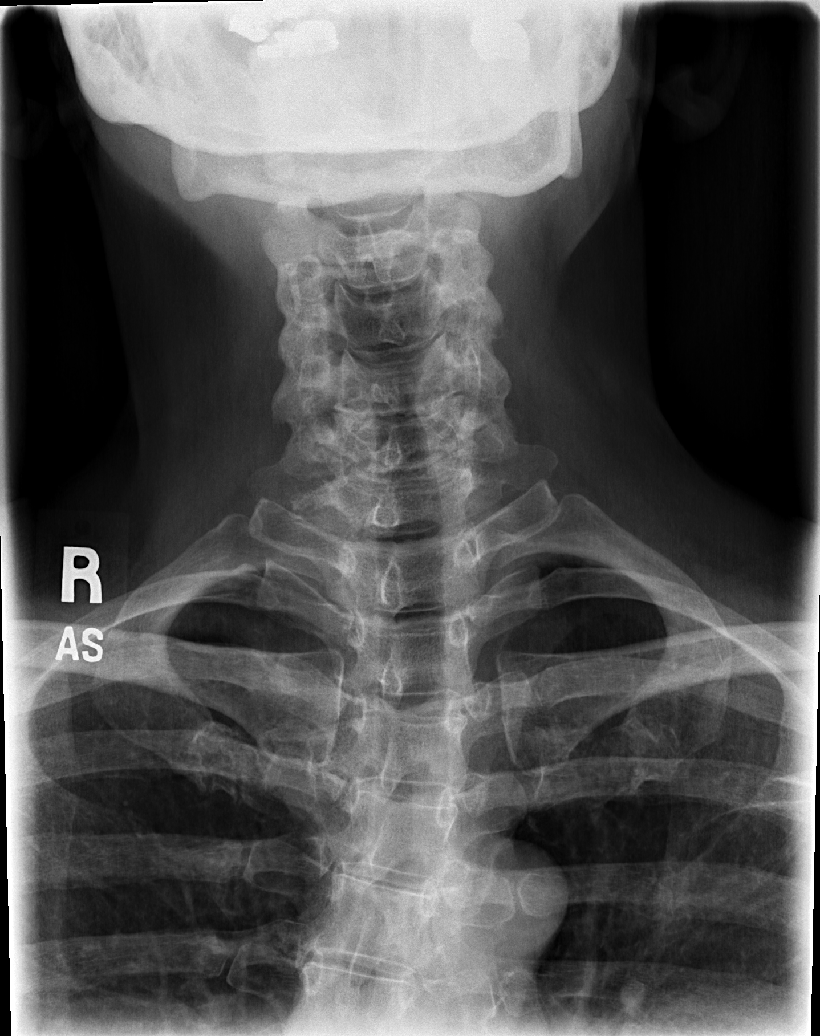

[w c-spine odontoid *]
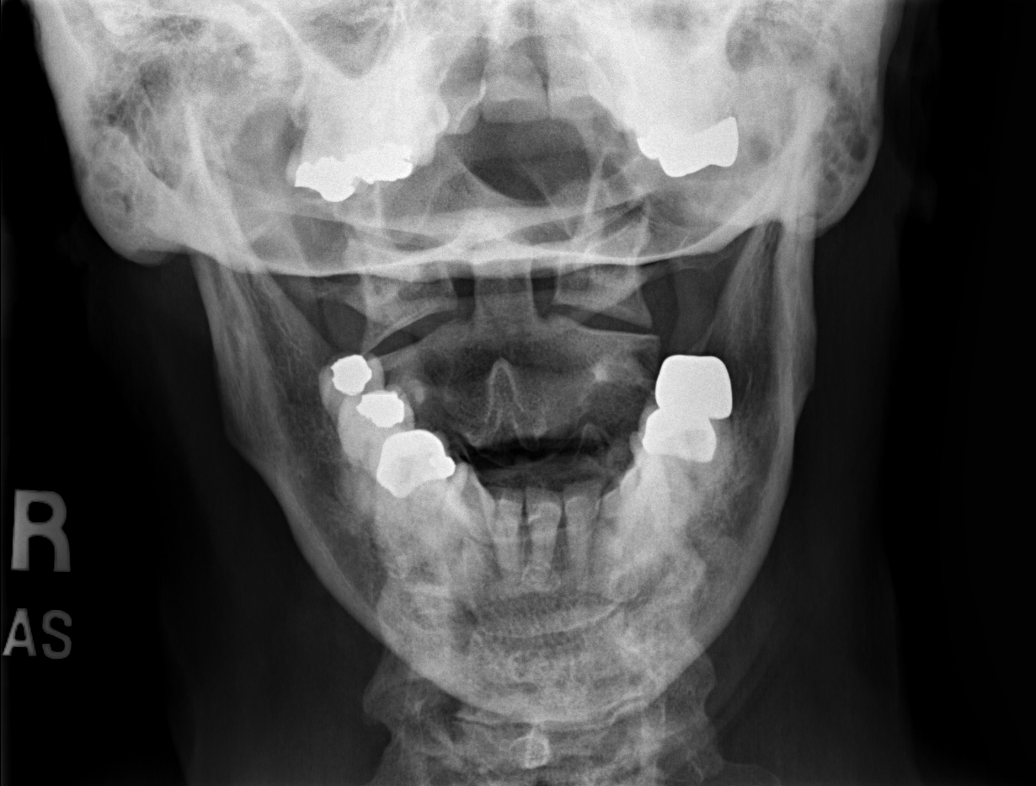

[5 of 5 positions shown; findings below may reference images not displayed]

FINDINGS: Frontal, lateral, open-mouth odontoid, and bilateral oblique views
were obtained. There is upper thoracic dextroscoliosis. There is no
fracture or spondylolisthesis. Prevertebral soft tissues and
predental space regions are normal. There is severe disc space
narrowing C5-6 and C6-7. Other disc spaces appear unremarkable.
There is facet hypertrophy with exit foraminal narrowing at C3-4,
C4-5, C5-6, and C6-7 bilaterally. No erosive change. Lung apices are
clear.
IMPRESSION: Osteoarthritic change at several levels. No fracture or
spondylolisthesis. Upper thoracic dextroscoliosis noted.

## 2020-10-14 ENCOUNTER — Other Ambulatory Visit: Payer: Self-pay | Admitting: Obstetrics and Gynecology

## 2020-10-14 DIAGNOSIS — Z1231 Encounter for screening mammogram for malignant neoplasm of breast: Secondary | ICD-10-CM

## 2020-12-31 ENCOUNTER — Other Ambulatory Visit: Payer: Self-pay

## 2020-12-31 ENCOUNTER — Ambulatory Visit
Admission: RE | Admit: 2020-12-31 | Discharge: 2020-12-31 | Disposition: A | Discharge status: Home / Self Care | Payer: BC Managed Care – PPO | Source: Ambulatory Visit | Attending: Obstetrics and Gynecology | Admitting: Obstetrics and Gynecology

## 2020-12-31 DIAGNOSIS — Z1231 Encounter for screening mammogram for malignant neoplasm of breast: Secondary | ICD-10-CM

## 2021-01-17 ENCOUNTER — Encounter: Payer: Self-pay | Admitting: Interventional Cardiology

## 2021-01-17 ENCOUNTER — Other Ambulatory Visit: Payer: Self-pay

## 2021-01-17 ENCOUNTER — Ambulatory Visit: Payer: BC Managed Care – PPO | Admitting: Interventional Cardiology

## 2021-01-17 VITALS — BP 136/88 | HR 63 | Ht 61.0 in | Wt 156.2 lb

## 2021-01-17 DIAGNOSIS — I340 Nonrheumatic mitral (valve) insufficiency: Secondary | ICD-10-CM | POA: Diagnosis not present

## 2021-01-17 DIAGNOSIS — I491 Atrial premature depolarization: Secondary | ICD-10-CM

## 2021-01-17 DIAGNOSIS — I10 Essential (primary) hypertension: Secondary | ICD-10-CM | POA: Diagnosis not present

## 2021-01-17 DIAGNOSIS — I493 Ventricular premature depolarization: Secondary | ICD-10-CM

## 2021-01-17 NOTE — Patient Instructions (Signed)
Medication Instructions:  ?Your physician recommends that you continue on your current medications as directed. Please refer to the Current Medication list given to you today. ? ?*If you need a refill on your cardiac medications before your next appointment, please call your pharmacy* ? ? ?Lab Work: ?none ?If you have labs (blood work) drawn today and your tests are completely normal, you will receive your results only by: ?MyChart Message (if you have MyChart) OR ?A paper copy in the mail ?If you have any lab test that is abnormal or we need to change your treatment, we will call you to review the results. ? ? ?Testing/Procedures: ?none ? ? ?Follow-Up: ?At [facility], you and your health needs are our priority.  As part of our continuing mission to provide you with exceptional heart care, we have created designated Provider Care Teams.  These Care Teams include your primary Cardiologist (physician) and Advanced Practice Providers (APPs -  Physician Assistants and Nurse Practitioners) who all work together to provide you with the care you need, when you need it. ? ?We recommend signing up for the patient portal called "MyChart".  Sign up information is provided on this After Visit Summary.  MyChart is used to connect with patients for Virtual Visits (Telemedicine).  Patients are able to view lab/test results, encounter notes, upcoming appointments, etc.  Non-urgent messages can be sent to your provider as well.   ?To learn more about what you can do with MyChart, go to [email or web address].   ? ?Your next appointment:   ?As needed ? ?The format for your next appointment:   ?In Person ? ?Provider:   ?Jayadeep Varanasi, MD   ? ? ?Other Instructions ? ? ?

## 2021-01-17 NOTE — Progress Notes (Signed)
Cardiology Office Note   Date:  01/17/2021   ID:  [name] [name], DOB 1955/08/03, MRN [id number]  PCP:  [name] Ishihara, MD    No chief complaint on file.    Wt Readings from Last 3 Encounters:  01/17/21 156 lb 3.2 oz (70.9 kg)  04/22/20 153 lb (69.4 kg)  02/13/20 148 lb (67.1 kg)       History of Present Illness: [name] [name] is a 65 y.o. female who is being seen today for the evaluation of palpitations at the request of Dr. Chales [name].    She has a history of polycystic kidney disease.  SHe has had PVC and PACs in the past, diagnosed in 2010.  She    She had a w/u for the palpitations in 2010.    She again had a monitor in 2018: "Normal sinus rhythm with occasional PACs and PVCs. No pathologic arrhythmias. Palipitations did correlate to PVCs on occasion."   More recently, she had recurrence of her palpitations when she was especially stressed about her daughter being in the path of hurricane [place].  This was at the end of September and early October.  Since that time, symptoms have improved.  Palpitations felt like an isolated skipping in her chest.  Worse when she was lying down and still.  Denies : Chest pain. Dizziness. Leg edema. Nitroglycerin use. Orthopnea. Paroxysmal nocturnal dyspnea. Shortness of breath. Syncope.     Past Medical History:  Diagnosis Date   ADPKD (autosomal dominant polycystic kidney)    Allergic rhinitis    Arthritis of hand    Asthma    Cervical radiculopathy due to osteoarthritis of spine    GAD (generalized anxiety disorder)    History of adenomatous polyp of colon    Hypertension    Liver cyst    Moderate persistent reactive airway disease with acute exacerbation    Polycystic kidney    Polycystic kidney disease    followed at [facility], stage II PKD   Pruritus ani    PVC (premature ventricular contraction)    Renal stone    Spinal stenosis     Past Surgical History:  Procedure Laterality Date    AUGMENTATION MAMMAPLASTY Bilateral    BREAST ENHANCEMENT SURGERY  1999   DILATION AND CURETTAGE OF UTERUS  1992   missed AB   TUBAL LIGATION Bilateral 1993     Current Outpatient Medications  Medication Sig Dispense Refill   amLODipine (NORVASC) 5 MG tablet Take 5 mg by mouth daily.      calcium carbonate (OS-CAL) 600 MG TABS Take 600 mg by mouth daily.     cetirizine (ZYRTEC) 10 MG tablet Take 10 mg by mouth daily.     Cholecalciferol (D3 HIGH POTENCY) 50 MCG (2000 UT) CAPS Take by mouth.     diclofenac Sodium (VOLTAREN) 1 % GEL Apply topically as needed.     LORazepam (ATIVAN) 0.5 MG tablet Take 0.5 mg by mouth as needed.     montelukast (SINGULAIR) 10 MG tablet Take 10 mg by mouth daily.  0   Multiple Vitamin (MULTIVITAMIN) capsule Take 1 capsule by mouth daily.     PROAIR HFA 108 (90 BASE) MCG/ACT inhaler as needed.     SYMBICORT 160-4.5 MCG/ACT inhaler Inhale 2 puffs into the lungs daily.      triamcinolone (NASACORT) 55 MCG/ACT AERO nasal inhaler 2 sprays daily as needed. nasal     VITAMIN D PO Take 2,000 Units by mouth.  ESTRADIOL ACETATE VA Place 0.5 mLs vaginally 2 (two) times a week. (Patient not taking: Reported on 01/17/2021)     No current facility-administered medications for this visit.    Allergies:   Ace inhibitors, Cefuroxime axetil, Irbesartan, Prinivil [lisinopril], Sulfonamide derivatives, and Fexofenadine    Social History:  The patient  reports that she has never smoked. She has never used smokeless tobacco. She reports that she does not drink alcohol and does not use drugs.   Family History:  The patient's family history includes Breast cancer in her daughter and maternal aunt; Cancer in her maternal aunt; Hyperlipidemia in her mother; Hypertension in her father and mother; Stroke in her mother.    ROS:  Please see the history of present illness.   Otherwise, review of systems are positive for palpitations.   All other systems are reviewed and negative.     PHYSICAL EXAM: VS:  BP 136/88   Pulse 63   Ht 5\' 1"  (1.549 m)   Wt 156 lb 3.2 oz (70.9 kg)   LMP 03/03/2007 (Approximate)   SpO2 95%   BMI 29.51 kg/m  , BMI Body mass index is 29.51 kg/m. GEN: Well nourished, well developed, in no acute distress HEENT: normal Neck: no JVD, carotid bruits, or masses Cardiac: RRR; no murmurs, rubs, or gallops,no edema  Respiratory:  clear to auscultation bilaterally, normal work of breathing GI: soft, nontender, nondistended, + BS MS: no deformity or atrophy Skin: warm and dry, no rash Neuro:  Strength and sensation are intact Psych: euthymic mood, full affect   EKG:   The ekg ordered today demonstrates NSR, no ST changes   Recent Labs: No results found for requested labs within last 8760 hours.   Lipid Panel    Component Value Date/Time   CHOL 163 12/12/2007 0750   TRIG 159 (H) 12/12/2007 0750   HDL 52.8 12/12/2007 0750   CHOLHDL 3.1 CALC 12/12/2007 0750   VLDL 32 12/12/2007 0750   LDLCALC 78 12/12/2007 0750     Other studies Reviewed: Additional studies/ records that were reviewed today with results demonstrating: prior echo.   ASSESSMENT AND PLAN:  PVCs/PACs: noted in the past.  Worse when she got stressed about [name] [name] in September 2022.  Sx have improved.  SOme premature beats noted on exam today.  No CHF on exam.  We discussed as needed metoprolol.  She thought she may have had a reaction to this in the past it is not listed as an allergy.  Could try short acting diltiazem 30 mg p.o. twice daily for palpitations if symptoms recur.  Could also consider switching amlodipine to diltiazem if symptoms became more prominent.  Since they are improving, will not make any changes at this time. HTN: The current medical regimen is effective;  continue present plan and medications. MVP: Mild mitral regurgitation noted in 2010.  No change in mitral regurgitation noted in 2018.  No mitral valve prolapse was noted at that time either.   No significant murmur on exam.    Current medicines are reviewed at length with the patient today.  The patient concerns regarding her medicines were addressed.  The following changes have been made:  No change  Labs/ tests ordered today include:  No orders of the defined types were placed in this encounter.   Recommend 150 minutes/week of aerobic exercise Low fat, low carb, high fiber diet recommended  Disposition:   FU as needed   Signed, Larae Grooms, MD  01/17/2021 9:30  AM    [facility] [place], [place], [place]  [address] Phone: [phone]; Fax: [phone]

## 2021-01-20 NOTE — Progress Notes (Signed)
65 y.o. [id number] Married White or Caucasian Not Hispanic or Latino female here for annual exam.   No vaginal bleeding. Sexually active, no pain. No bowel or bladder c/o.    Last year her 68 year old daughter was diagnosed with stage 3 breast cancer. She is doing well. Negative genetic testing.   Patient's last menstrual period was 03/03/2007 (approximate).          Sexually active: Yes.    The current method of family planning is post menopausal status.    Exercising: Yes.     Walking  Smoker:  no  Health Maintenance: Pap:    09-24-15 neg HPV HR neg 09/14/13 WNL  History of abnormal Pap:  no MMG:  01/01/21 density B Bi-rads 1 neg  BMD:   01/14/16, normal.  Colonoscopy: 11/2017 polyps. F/u 5 years. [facility] TDaP:  2014 Gardasil: n/a   reports that she has never smoked. She has never used smokeless tobacco. She reports that she does not drink alcohol and does not use drugs. 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 2 daughters. 2 grand sons, 9 and 5. Younger daughter is getting married.   Past Medical History:  Diagnosis Date   ADPKD (autosomal dominant polycystic kidney)    Allergic rhinitis    Arthritis of hand    Asthma    Cervical radiculopathy due to osteoarthritis of spine    GAD (generalized anxiety disorder)    History of adenomatous polyp of colon    Hypertension    Liver cyst    Moderate persistent reactive airway disease with acute exacerbation    Polycystic kidney    Polycystic kidney disease    followed at [facility], stage II PKD   Pruritus ani    PVC (premature ventricular contraction)    Renal stone    Spinal stenosis     Past Surgical History:  Procedure Laterality Date   AUGMENTATION MAMMAPLASTY Bilateral    BREAST ENHANCEMENT SURGERY  1999   DILATION AND CURETTAGE OF UTERUS  1992   missed AB   TUBAL LIGATION Bilateral 1993    Current Outpatient Medications  Medication Sig Dispense Refill   amLODipine (NORVASC) 5 MG tablet Take 5 mg by mouth daily.       calcium carbonate (OS-CAL) 600 MG TABS Take 600 mg by mouth daily.     cetirizine (ZYRTEC) 10 MG tablet Take 10 mg by mouth daily.     Cholecalciferol (D3 HIGH POTENCY) 50 MCG (2000 UT) CAPS Take by mouth.     diclofenac Sodium (VOLTAREN) 1 % GEL Apply topically as needed.     LORazepam (ATIVAN) 0.5 MG tablet Take 0.5 mg by mouth as needed.     montelukast (SINGULAIR) 10 MG tablet Take 10 mg by mouth daily.  0   Multiple Vitamin (MULTIVITAMIN) capsule Take 1 capsule by mouth daily.     PROAIR HFA 108 (90 BASE) MCG/ACT inhaler as needed.     SYMBICORT 160-4.5 MCG/ACT inhaler Inhale 2 puffs into the lungs daily.      triamcinolone (NASACORT) 55 MCG/ACT AERO nasal inhaler 2 sprays daily as needed. nasal     VITAMIN D PO Take 2,000 Units by mouth.     No current facility-administered medications for this visit.    Family History  Problem Relation Age of Onset   Hypertension Mother    Hyperlipidemia Mother    Stroke Mother    Hypertension Father    Cancer Maternal Aunt  breast   Breast cancer Maternal Aunt    Breast cancer Daughter        stage 3    Review of Systems  All other systems reviewed and are negative.  Exam:   BP (!) 180/68   Pulse 88   Ht 5\' 1"  (1.549 m)   Wt 157 lb (71.2 kg)   LMP 03/03/2007 (Approximate)   SpO2 98%   BMI 29.66 kg/m   Weight change: @WEIGHTCHANGE @ Height:   Height: 5\' 1"  (154.9 cm)  Ht Readings from Last 3 Encounters:  01/28/21 5\' 1"  (1.549 m)  01/17/21 5\' 1"  (1.549 m)  04/22/20 5\' 1"  (1.549 m)    General appearance: alert, cooperative and appears stated age Head: Normocephalic, without obvious abnormality, atraumatic Neck: no adenopathy, supple, symmetrical, trachea midline and thyroid normal to inspection and palpation Lungs: clear to auscultation bilaterally Cardiovascular: regular rate and rhythm Breasts: normal appearance, no masses or tenderness, bilateral implants Abdomen: soft, non-tender; non distended,  no masses,  no  organomegaly Extremities: extremities normal, atraumatic, no cyanosis or edema Skin: Skin color, texture, turgor normal. No rashes or lesions Lymph nodes: Cervical, supraclavicular, and axillary nodes normal. No abnormal inguinal nodes palpated Neurologic: Grossly normal   Pelvic: External genitalia: there is whitening above the clitoris and mild agglutination of the clitoris to the labia majora. No plaques or fissures.               Urethra:  normal appearing urethra with no masses, tenderness or lesions              Bartholins and Skenes: normal                 Vagina: mildly atrophic appearing vagina with normal color and discharge, no lesions. Able to insert 2 fingers vaginally               Cervix: no lesions               Bimanual Exam:  Uterus:  normal size, contour, position, consistency, mobility, non-tender              Adnexa: no mass, fullness, tenderness               Rectovaginal: Confirms               Anus:  normal sphincter tone, no lesions  Gae Dry chaperoned for the exam.  1. Well woman exam Discussed breast self exam Discussed calcium and vit D intake Mammogram, colonoscopy, & DEXA are UTD  2. Screening for cervical cancer - Cytology - PAP  3. Dyspareunia, female Discussed lubrication, her controlling rate and depth of penetration - Estradiol 10 MCG TABS vaginal tablet; Place one tablet vaginally qhs for one week, then change to 2 x a week at hs.  Dispense: 24 tablet; Refill: 4  4. Vaginal atrophy - Estradiol 10 MCG TABS vaginal tablet; Place one tablet vaginally qhs for one week, then change to 2 x a week at hs.  Dispense: 24 tablet; Refill: 4  5. Vulvar disorder Exam concerning for lichen sclerosis. After the exam she states she does get itchy in the are of concern.  - Biopsy vulva; Future  Elevated BP, will recheck. She states her BP has been fine at home and at her Cardiologist. If still high she will monitor and f/u with primary. 160/78, she will  f/u with her primary.

## 2021-01-28 ENCOUNTER — Encounter: Payer: Self-pay | Admitting: Obstetrics and Gynecology

## 2021-01-28 ENCOUNTER — Ambulatory Visit: Payer: BC Managed Care – PPO | Admitting: Nurse Practitioner

## 2021-01-28 ENCOUNTER — Ambulatory Visit (INDEPENDENT_AMBULATORY_CARE_PROVIDER_SITE_OTHER): Payer: BC Managed Care – PPO | Admitting: Obstetrics and Gynecology

## 2021-01-28 ENCOUNTER — Other Ambulatory Visit (HOSPITAL_COMMUNITY)
Admission: RE | Admit: 2021-01-28 | Discharge: 2021-01-28 | Disposition: A | Discharge status: Home / Self Care | Payer: BC Managed Care – PPO | Source: Ambulatory Visit | Attending: Obstetrics and Gynecology | Admitting: Obstetrics and Gynecology

## 2021-01-28 ENCOUNTER — Other Ambulatory Visit: Payer: Self-pay

## 2021-01-28 VITALS — BP 180/68 | HR 88 | Ht 61.0 in | Wt 157.0 lb

## 2021-01-28 DIAGNOSIS — Z01419 Encounter for gynecological examination (general) (routine) without abnormal findings: Secondary | ICD-10-CM | POA: Diagnosis not present

## 2021-01-28 DIAGNOSIS — N952 Postmenopausal atrophic vaginitis: Secondary | ICD-10-CM | POA: Diagnosis not present

## 2021-01-28 DIAGNOSIS — E559 Vitamin D deficiency, unspecified: Secondary | ICD-10-CM | POA: Insufficient documentation

## 2021-01-28 DIAGNOSIS — K7689 Other specified diseases of liver: Secondary | ICD-10-CM | POA: Insufficient documentation

## 2021-01-28 DIAGNOSIS — Q613 Polycystic kidney, unspecified: Secondary | ICD-10-CM | POA: Insufficient documentation

## 2021-01-28 DIAGNOSIS — F411 Generalized anxiety disorder: Secondary | ICD-10-CM | POA: Insufficient documentation

## 2021-01-28 DIAGNOSIS — Z86010 Personal history of colonic polyps: Secondary | ICD-10-CM | POA: Insufficient documentation

## 2021-01-28 DIAGNOSIS — N941 Unspecified dyspareunia: Secondary | ICD-10-CM | POA: Diagnosis not present

## 2021-01-28 DIAGNOSIS — N909 Noninflammatory disorder of vulva and perineum, unspecified: Secondary | ICD-10-CM

## 2021-01-28 DIAGNOSIS — Z124 Encounter for screening for malignant neoplasm of cervix: Secondary | ICD-10-CM

## 2021-01-28 DIAGNOSIS — M19049 Primary osteoarthritis, unspecified hand: Secondary | ICD-10-CM | POA: Insufficient documentation

## 2021-01-28 DIAGNOSIS — J4520 Mild intermittent asthma, uncomplicated: Secondary | ICD-10-CM | POA: Insufficient documentation

## 2021-01-28 DIAGNOSIS — J4541 Moderate persistent asthma with (acute) exacerbation: Secondary | ICD-10-CM | POA: Insufficient documentation

## 2021-01-28 DIAGNOSIS — M47812 Spondylosis without myelopathy or radiculopathy, cervical region: Secondary | ICD-10-CM | POA: Insufficient documentation

## 2021-01-28 DIAGNOSIS — Z860101 Personal history of adenomatous and serrated colon polyps: Secondary | ICD-10-CM | POA: Insufficient documentation

## 2021-01-28 MED ORDER — ESTRADIOL 10 MCG VA TABS: ORAL_TABLET | VAGINAL | 4 refills | Status: DC

## 2021-01-28 NOTE — Patient Instructions (Addendum)
Try uberlube for vaginal lubrication.  You have vulvar skin changes concerning for lichen sclerosis.    EXERCISE   We recommended that you start or continue a regular exercise program for good health. Physical activity is anything that gets your body moving, some is better than none. The CDC recommends 150 minutes per week of Moderate-Intensity Aerobic Activity and 2 or more days of Muscle Strengthening Activity.  Benefits of exercise are limitless: helps weight loss/weight maintenance, improves mood and energy, helps with depression and anxiety, improves sleep, tones and strengthens muscles, improves balance, improves bone density, protects from chronic conditions such as heart disease, high blood pressure and diabetes and so much more. To learn more visit: http://kirby-bean.org/  DIET: Good nutrition starts with a healthy diet of fruits, vegetables, whole grains, and lean protein sources. Drink plenty of water for hydration. Minimize empty calories, sodium, sweets. For more information about dietary recommendations visit: CriticalGas.be and https://www.carpenter-henry.info/  ALCOHOL:  Women should limit their alcohol intake to no more than 7 drinks/beers/glasses of wine (combined, not each!) per week. Moderation of alcohol intake to this level decreases your risk of breast cancer and liver damage.  If you are concerned that you may have a problem, or your friends have told you they are concerned about your drinking, there are many resources to help. A well-known program that is free, effective, and available to all people all over the nation is Alcoholics Anonymous.  Check out this site to learn more: BeverageBargains.co.za   CALCIUM AND VITAMIN D:  Adequate intake of calcium and Vitamin D are recommended for bone health.  You should be getting between 1000-1200 mg of calcium and 800 units of Vitamin D daily between diet and  supplements  PAP SMEARS:  Pap smears, to check for cervical cancer or precancers,  have traditionally been done yearly, scientific advances have shown that most women can have pap smears less often.  However, every woman still should have a physical exam from her gynecologist every year. It will include a breast check, inspection of the vulva and vagina to check for abnormal growths or skin changes, a visual exam of the cervix, and then an exam to evaluate the size and shape of the uterus and ovaries. We will also provide age appropriate advice regarding health maintenance, like when you should have certain vaccines, screening for sexually transmitted diseases, bone density testing, colonoscopy, mammograms, etc.   MAMMOGRAMS:  All women over 74 years old should have a routine mammogram.   COLON CANCER SCREENING: Now recommend starting at age 76. At this time colonoscopy is not covered for routine screening until 50. There are take home tests that can be done between 45-49.   COLONOSCOPY:  Colonoscopy to screen for colon cancer is recommended for all women at age 86.  We know, you hate the idea of the prep.  We agree, BUT, having colon cancer and not knowing it is worse!!  Colon cancer so often starts as a polyp that can be seen and removed at colonscopy, which can quite literally save your life!  And if your first colonoscopy is normal and you have no family history of colon cancer, most women don't have to have it again for 10 years.  Once every ten years, you can do something that may end up saving your life, right?  We will be happy to help you get it scheduled when you are ready.  Be sure to check your insurance coverage so you understand how much it  will cost.  It may be covered as a preventative service at no cost, but you should check your particular policy.      Breast Self-Awareness Breast self-awareness means being familiar with how your breasts look and feel. It involves checking your breasts  regularly and reporting any changes to your health care provider. Practicing breast self-awareness is important. A change in your breasts can be a sign of a serious medical problem. Being familiar with how your breasts look and feel allows you to find any problems early, when treatment is more likely to be successful. All women should practice breast self-awareness, including women who have had breast implants. How to do a breast self-exam One way to learn what is normal for your breasts and whether your breasts are changing is to do a breast self-exam. To do a breast self-exam: Look for Changes  Remove all the clothing above your waist. Stand in front of a mirror in a room with good lighting. Put your hands on your hips. Push your hands firmly downward. Compare your breasts in the mirror. Look for differences between them (asymmetry), such as: Differences in shape. Differences in size. Puckers, dips, and bumps in one breast and not the other. Look at each breast for changes in your skin, such as: Redness. Scaly areas. Look for changes in your nipples, such as: Discharge. Bleeding. Dimpling. Redness. A change in position. Feel for Changes Carefully feel your breasts for lumps and changes. It is best to do this while lying on your back on the floor and again while sitting or standing in the shower or tub with soapy water on your skin. Feel each breast in the following way: Place the arm on the side of the breast you are examining above your head. Feel your breast with the other hand. Start in the nipple area and make  inch (2 cm) overlapping circles to feel your breast. Use the pads of your three middle fingers to do this. Apply light pressure, then medium pressure, then firm pressure. The light pressure will allow you to feel the tissue closest to the skin. The medium pressure will allow you to feel the tissue that is a little deeper. The firm pressure will allow you to feel the tissue  close to the ribs. Continue the overlapping circles, moving downward over the breast until you feel your ribs below your breast. Move one finger-width toward the center of the body. Continue to use the  inch (2 cm) overlapping circles to feel your breast as you move slowly up toward your collarbone. Continue the up and down exam using all three pressures until you reach your armpit.  Write Down What You Find  Write down what is normal for each breast and any changes that you find. Keep a written record with breast changes or normal findings for each breast. By writing this information down, you do not need to depend only on memory for size, tenderness, or location. Write down where you are in your menstrual cycle, if you are still menstruating. If you are having trouble noticing differences in your breasts, do not get discouraged. With time you will become more familiar with the variations in your breasts and more comfortable with the exam. How often should I examine my breasts? Examine your breasts every month. If you are breastfeeding, the best time to examine your breasts is after a feeding or after using a breast pump. If you menstruate, the best time to examine your breasts is 5-7  days after your period is over. During your period, your breasts are lumpier, and it may be more difficult to notice changes. When should I see my health care provider? See your health care provider if you notice: A change in shape or size of your breasts or nipples. A change in the skin of your breast or nipples, such as a reddened or scaly area. Unusual discharge from your nipples. A lump or thick area that was not there before. Pain in your breasts. Anything that concerns you.

## 2021-01-29 ENCOUNTER — Ambulatory Visit: Payer: BC Managed Care – PPO | Admitting: Obstetrics and Gynecology

## 2021-01-29 LAB — CYTOLOGY - PAP
Comment: NEGATIVE
Diagnosis: NEGATIVE
High risk HPV: NEGATIVE

## 2021-01-30 ENCOUNTER — Ambulatory Visit: Payer: BC Managed Care – PPO | Admitting: Obstetrics and Gynecology

## 2021-01-30 ENCOUNTER — Other Ambulatory Visit: Payer: Self-pay

## 2021-01-30 ENCOUNTER — Encounter: Payer: Self-pay | Admitting: Obstetrics and Gynecology

## 2021-01-30 ENCOUNTER — Other Ambulatory Visit (HOSPITAL_COMMUNITY)
Admission: RE | Admit: 2021-01-30 | Discharge: 2021-01-30 | Disposition: A | Discharge status: Home / Self Care | Payer: BC Managed Care – PPO | Source: Ambulatory Visit | Attending: Obstetrics and Gynecology | Admitting: Obstetrics and Gynecology

## 2021-01-30 DIAGNOSIS — N909 Noninflammatory disorder of vulva and perineum, unspecified: Secondary | ICD-10-CM | POA: Insufficient documentation

## 2021-01-30 NOTE — Progress Notes (Signed)
GYNECOLOGY  VISIT   HPI: 65 y.o.   Married White or Caucasian Not Hispanic or Latino  female   [phone] with Patient's last menstrual period was 03/03/2007 (approximate).   here for vulvar biopsy, recent exam concerning for lichen sclerosis.   GYNECOLOGIC HISTORY: Patient's last menstrual period was 03/03/2007 (approximate). Contraception:post menopausal  Menopausal hormone therapy: estradiol vaginal tablets        OB History     Gravida  3   Para  2   Term  2   Preterm      AB  1   Living  2      SAB  1   IAB      Ectopic      Multiple      Live Births                 Patient Active Problem List   Diagnosis Date Noted   Cervical spondylosis without myelopathy 01/28/2021   Generalized anxiety disorder 01/28/2021   History of adenomatous polyp of colon 01/28/2021   Liver cyst 01/28/2021   Mild intermittent asthma 01/28/2021   Moderate persistent asthma with (acute) exacerbation 01/28/2021   Polycystic kidney disease 01/28/2021   Arthritis of hand 01/28/2021   Vitamin D deficiency 01/28/2021   Adult polycystic kidney disease 06/26/2014   UNSPECIFIED TACHYCARDIA 11/19/2008   PALPITATIONS 11/19/2008   WRIST PAIN 12/19/2007   HIP PAIN 12/19/2007   HEADACHE 06/16/2007   HYPERTENSION 12/27/2006   ASTHMA 12/27/2006   NEUROPATHY, OTHER INFLAMMATORY AND TOXIC 09/22/2006    Past Medical History:  Diagnosis Date   ADPKD (autosomal dominant polycystic kidney)    Allergic rhinitis    Arthritis of hand    Asthma    Cervical radiculopathy due to osteoarthritis of spine    GAD (generalized anxiety disorder)    History of adenomatous polyp of colon    Hypertension    Liver cyst    Moderate persistent reactive airway disease with acute exacerbation    Polycystic kidney    Polycystic kidney disease    followed at [facility], stage II PKD   Pruritus ani    PVC (premature ventricular contraction)    Renal stone    Spinal stenosis     Past Surgical  History:  Procedure Laterality Date   AUGMENTATION MAMMAPLASTY Bilateral    BREAST ENHANCEMENT SURGERY  1999   DILATION AND CURETTAGE OF UTERUS  1992   missed AB   TUBAL LIGATION Bilateral 1993    Current Outpatient Medications  Medication Sig Dispense Refill   amLODipine (NORVASC) 5 MG tablet Take 5 mg by mouth daily.      calcium carbonate (OS-CAL) 600 MG TABS Take 600 mg by mouth daily.     cetirizine (ZYRTEC) 10 MG tablet Take 10 mg by mouth daily.     Cholecalciferol (D3 HIGH POTENCY) 50 MCG (2000 UT) CAPS Take by mouth.     diclofenac Sodium (VOLTAREN) 1 % GEL Apply topically as needed.     Estradiol 10 MCG TABS vaginal tablet Place one tablet vaginally qhs for one week, then change to 2 x a week at hs. 24 tablet 4   LORazepam (ATIVAN) 0.5 MG tablet Take 0.5 mg by mouth as needed.     montelukast (SINGULAIR) 10 MG tablet Take 10 mg by mouth daily.  0   Multiple Vitamin (MULTIVITAMIN) capsule Take 1 capsule by mouth daily.     PROAIR HFA 108 (90 BASE) MCG/ACT inhaler as  needed.     SYMBICORT 160-4.5 MCG/ACT inhaler Inhale 2 puffs into the lungs daily.      triamcinolone (NASACORT) 55 MCG/ACT AERO nasal inhaler 2 sprays daily as needed. nasal     VITAMIN D PO Take 2,000 Units by mouth.     No current facility-administered medications for this visit.     ALLERGIES: Ace inhibitors, Cefuroxime axetil, Irbesartan, Prinivil [lisinopril], Sulfonamide derivatives, and Fexofenadine  Family History  Problem Relation Age of Onset   Hypertension Mother    Hyperlipidemia Mother    Stroke Mother    Hypertension Father    Cancer Maternal Aunt        breast   Breast cancer Maternal Aunt    Breast cancer Daughter        stage 3    Social History   Socioeconomic History   Marital status: Married    Spouse name: Not on file   Number of children: 2   Years of education: Not on file   Highest education level: Not on file  Occupational History   Occupation: DELIVERY SERVICE  COORDINATOR    Employer: [employer]  Tobacco Use   Smoking status: Never   Smokeless tobacco: Never  Vaping Use   Vaping Use: Never used  Substance and Sexual Activity   Alcohol use: No   Drug use: No   Sexual activity: Yes    Partners: Male    Birth control/protection: Post-menopausal  Other Topics Concern   Not on file  Social History Narrative   Right handed   Social Determinants of Health   Financial Resource Strain: Not on file  Food Insecurity: Not on file  Transportation Needs: Not on file  Physical Activity: Not on file  Stress: Not on file  Social Connections: Not on file  Intimate Partner Violence: Not on file    Review of Systems  All other systems reviewed and are negative.  PHYSICAL EXAMINATION:    BP (!) 148/96 (BP Location: Left Arm, Patient Position: Sitting, Cuff Size: Normal)   Pulse 88   Resp 12   Ht 5\' 1"  (1.549 m)   Wt 155 lb (70.3 kg)   LMP 03/03/2007 (Approximate)   BMI 29.29 kg/m     General appearance: alert, cooperative and appears stated age  Pelvic: External genitalia:  mild whitening around the clitoris with mild agglutination. 3 small circular areas of whitening on the inner left labia majora. The largest of this was biopsied.               Urethra:  normal appearing urethra with no masses, tenderness or lesions               The risks of the procedure were reviewed with the patient and a consent was signed. The area was cleansed with betadine and injected with 1% lidocaine. A 3 mm punch biopsy was used to remove a circular piece of tissue. The defect was closed with 4-0 vicryl. The patient tolerated the procedure well.   Chaperone was present for exam.  1. Vulvar disorder - Biopsy vulva - Surgical pathology( [facility])

## 2021-01-30 NOTE — Patient Instructions (Signed)

## 2021-02-03 ENCOUNTER — Other Ambulatory Visit: Payer: Self-pay | Admitting: Obstetrics and Gynecology

## 2021-02-03 ENCOUNTER — Other Ambulatory Visit: Payer: Self-pay | Admitting: Orthopedic Surgery

## 2021-02-03 DIAGNOSIS — M533 Sacrococcygeal disorders, not elsewhere classified: Secondary | ICD-10-CM

## 2021-02-03 LAB — SURGICAL PATHOLOGY

## 2021-02-03 MED ORDER — BETAMETHASONE VALERATE 0.1 % EX OINT: TOPICAL_OINTMENT | CUTANEOUS | 1 refills | Status: DC

## 2021-02-11 ENCOUNTER — Other Ambulatory Visit: Payer: Self-pay | Admitting: Orthopedic Surgery

## 2021-02-11 DIAGNOSIS — M533 Sacrococcygeal disorders, not elsewhere classified: Secondary | ICD-10-CM

## 2021-02-14 ENCOUNTER — Other Ambulatory Visit: Payer: BC Managed Care – PPO

## 2021-03-10 ENCOUNTER — Inpatient Hospital Stay: Admission: RE | Admit: 2021-03-10 | Payer: BC Managed Care – PPO | Source: Ambulatory Visit

## 2021-03-10 ENCOUNTER — Other Ambulatory Visit: Payer: BC Managed Care – PPO

## 2021-05-12 ENCOUNTER — Telehealth: Payer: Self-pay | Admitting: *Deleted

## 2021-05-12 NOTE — Telephone Encounter (Signed)
Patient called stating the she would like to have estradiol 10 mcg tablet removed from her medication list. Patient reports she tried mediation and it did NOT work for her. Only Rx she is using prescribed by Dr.Jertson is the betamethasone ointment.  ? ?Medication cancelled.  ?

## 2021-06-04 ENCOUNTER — Other Ambulatory Visit: Payer: Self-pay | Admitting: Nephrology

## 2021-06-04 DIAGNOSIS — N182 Chronic kidney disease, stage 2 (mild): Secondary | ICD-10-CM

## 2021-06-06 ENCOUNTER — Other Ambulatory Visit: Payer: BC Managed Care – PPO

## 2021-06-09 ENCOUNTER — Ambulatory Visit
Admission: RE | Admit: 2021-06-09 | Discharge: 2021-06-09 | Disposition: A | Discharge status: Home / Self Care | Payer: BC Managed Care – PPO | Source: Ambulatory Visit | Attending: Nephrology | Admitting: Nephrology

## 2021-06-09 DIAGNOSIS — N182 Chronic kidney disease, stage 2 (mild): Secondary | ICD-10-CM

## 2021-06-14 IMAGING — US US ABDOMEN LIMITED
1 series · 14 of 25 positions shown · non-contrast
Comparison: MR abdomen 01/16/2016

CLINICAL DATA: Soft palpable mass in RIGHT upper quadrant fell 5
position 2 weeks ago. History of polycystic kidney disease and
hepatic cysts

EXAM:
ULTRASOUND ABDOMEN LIMITED

[Series 1: us abdomen limited · 0.08mm/px · 14 of 31 slices shown]
[im 1/31]
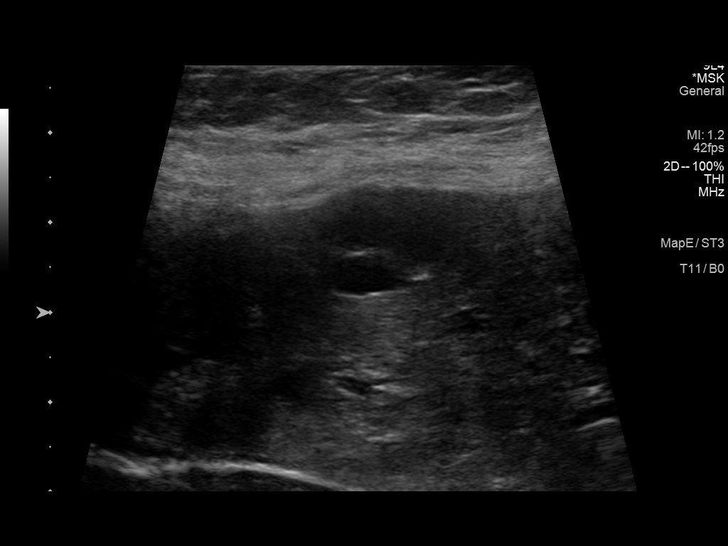
[im 3/31]
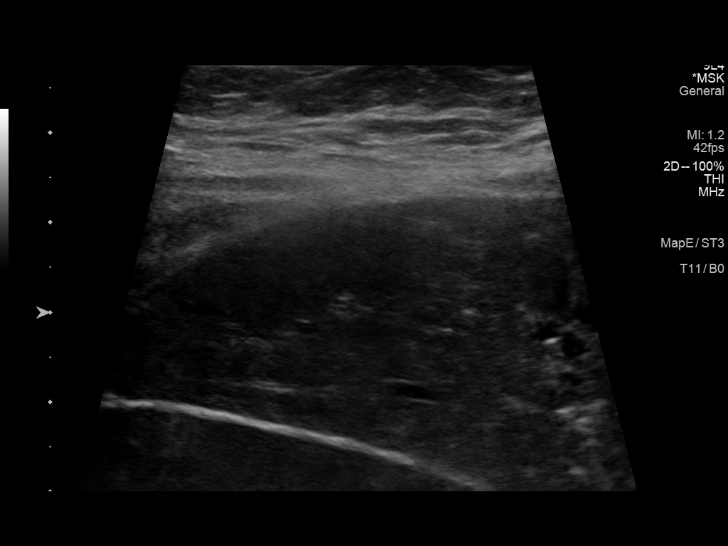
[im 6/31]
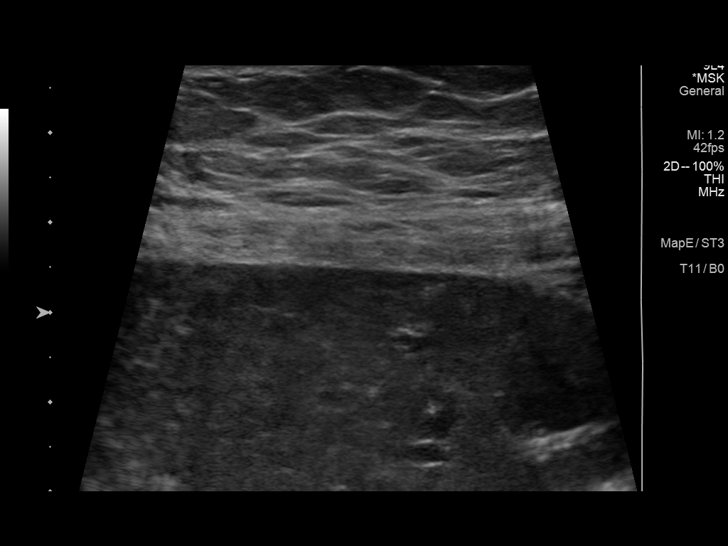
[im 8/31]
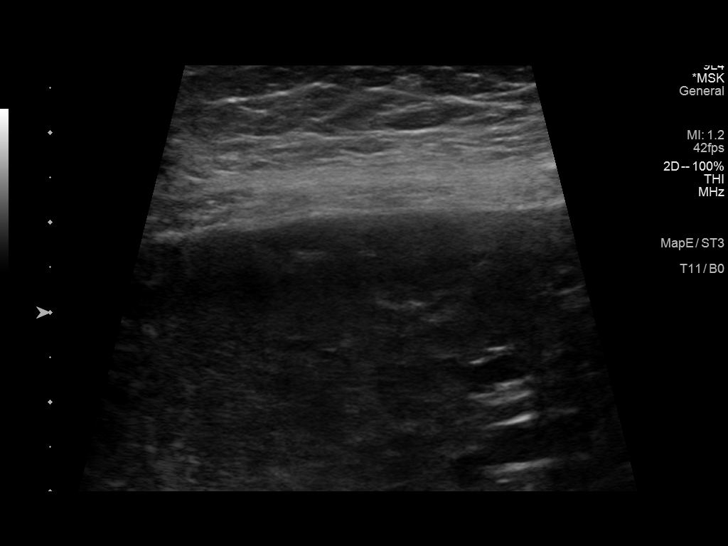
[im 11/31]
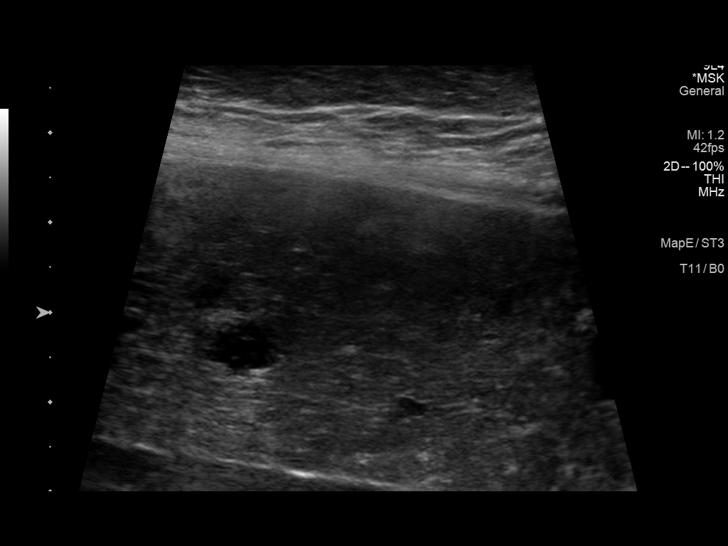
[im 12/31]
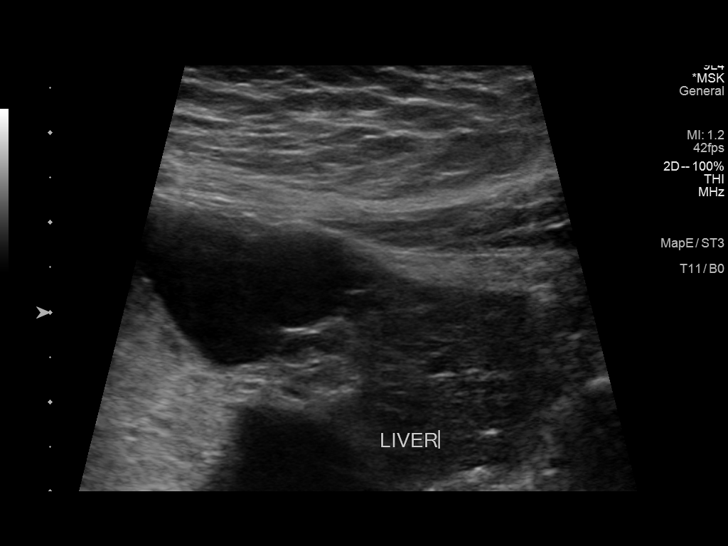
[im 14/31]
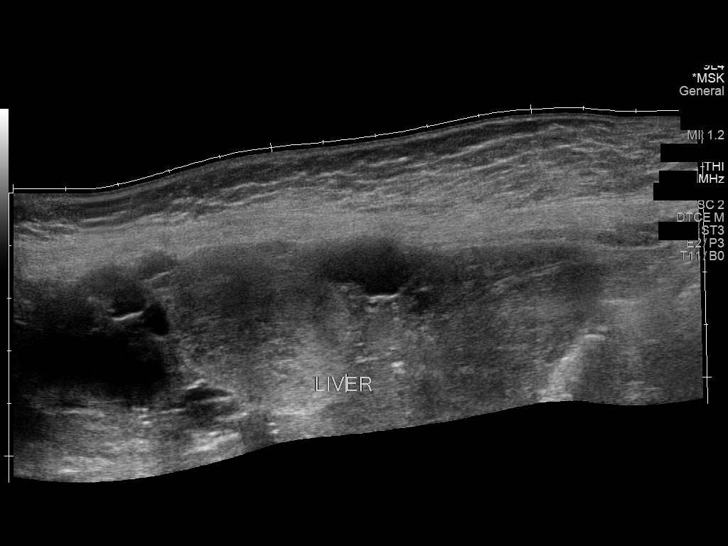
[im 17/31]
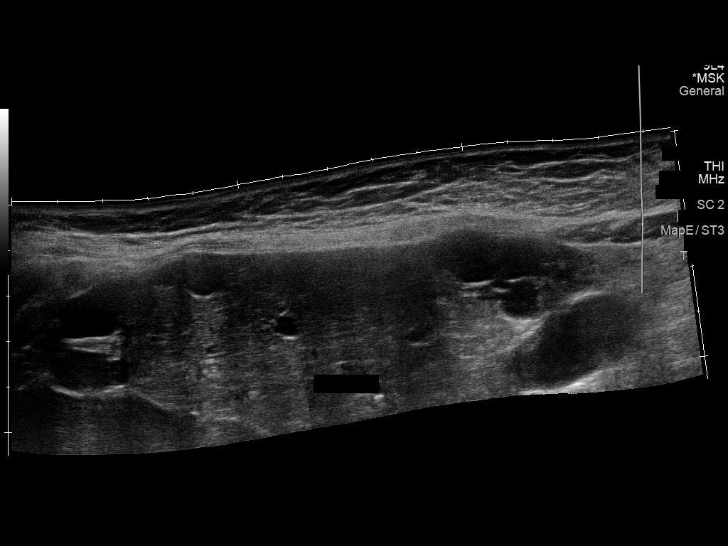
[im 19/31]
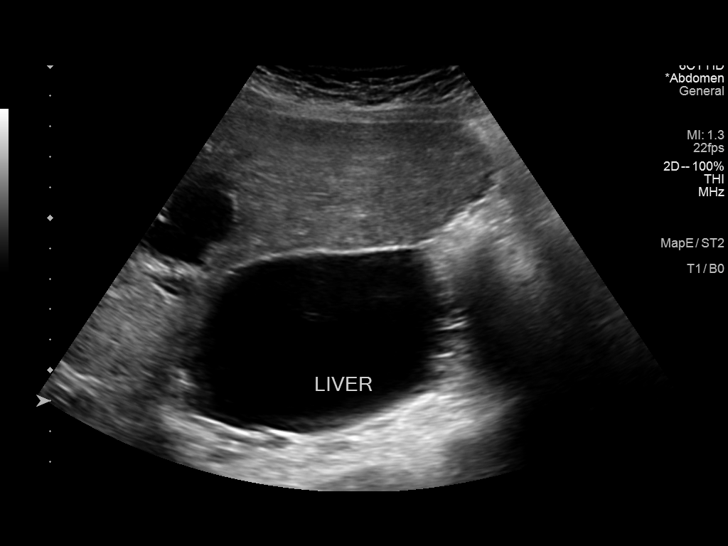
[im 21/31]
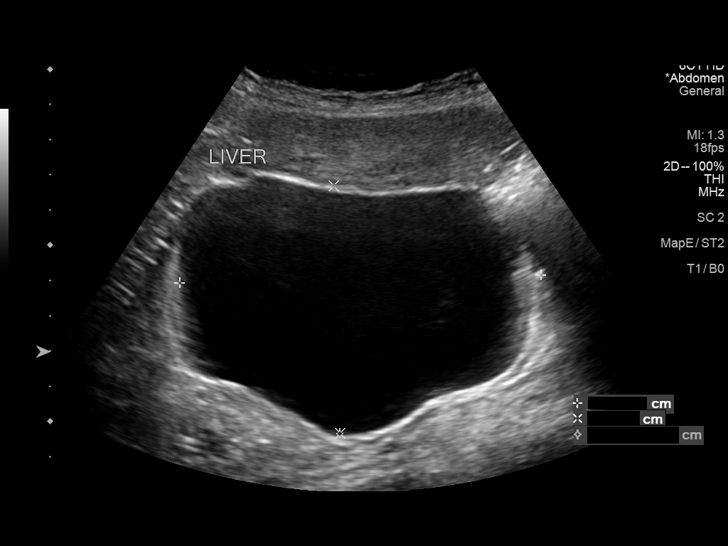
[im 23/31]
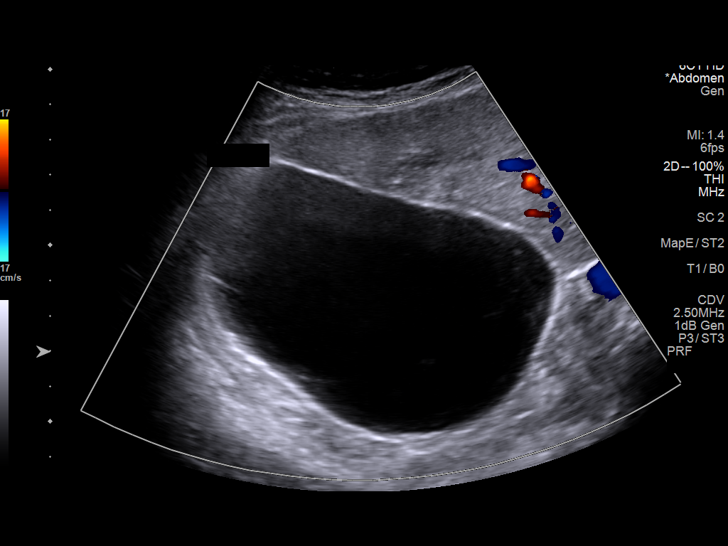
[im 26/31]
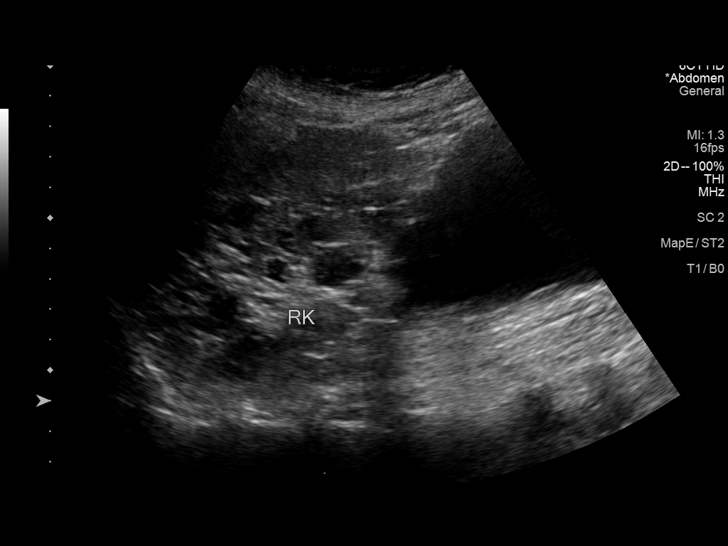
[im 28/31]
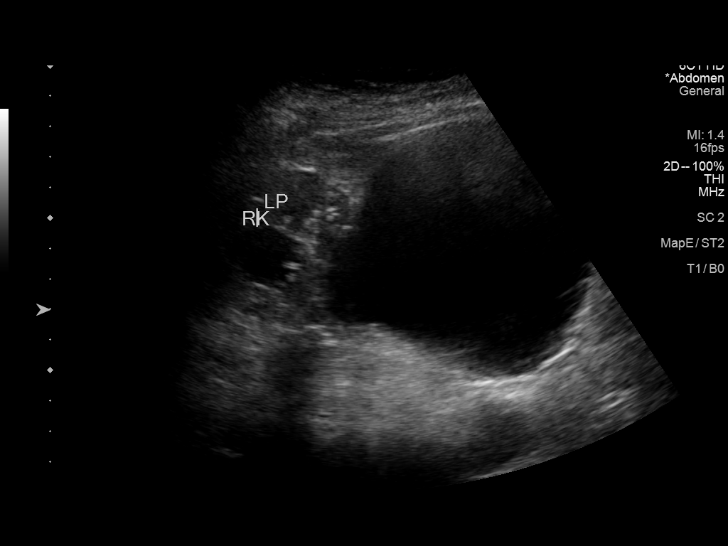
[im 31/31]
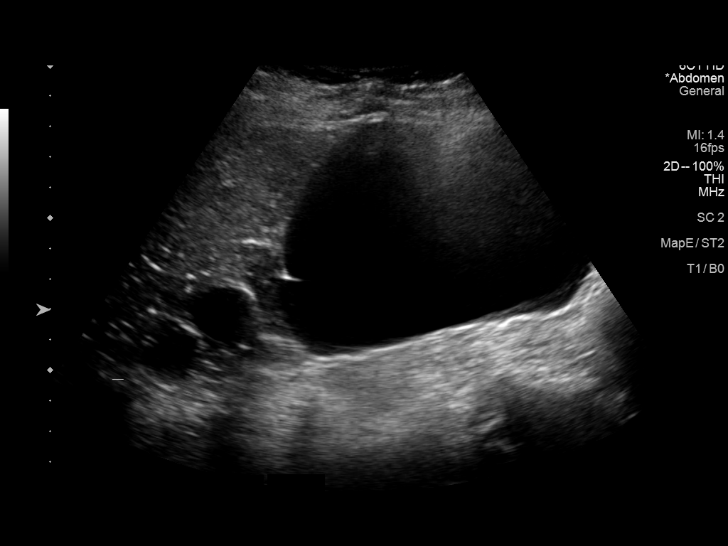

[14 of 25 positions shown; findings below may reference images not displayed]

FINDINGS: Subcutaneous soft tissues at the site of palpable concern are
unremarkable. Intact fascial/muscular planes. No soft tissue mass,
subcutaneous cyst or fluid collection identified. No calcifications
or hyperemia.

Underlying the site of palpable concern, heterogeneous liver is
identified which contains several large cysts, largest of which
measures 10.3 x 7.0 x 10.0 cm. Multiple RIGHT renal cysts are also
seen up to 10.1 cm diameter on limited assessment.
IMPRESSION: No RIGHT upper quadrant subcutaneous mass or hernia identified.

Multiple hepatic and renal cysts, largest of which is a 10.3 cm
hepatic cyst generally located at the site of reported palpable
concern.

## 2021-06-18 ENCOUNTER — Ambulatory Visit
Admission: RE | Admit: 2021-06-18 | Discharge: 2021-06-18 | Disposition: A | Discharge status: Home / Self Care | Payer: BC Managed Care – PPO | Source: Ambulatory Visit | Attending: Orthopedic Surgery | Admitting: Orthopedic Surgery

## 2021-06-18 DIAGNOSIS — M533 Sacrococcygeal disorders, not elsewhere classified: Secondary | ICD-10-CM

## 2021-07-01 ENCOUNTER — Other Ambulatory Visit: Payer: Self-pay | Admitting: *Deleted

## 2021-07-01 DIAGNOSIS — I10 Essential (primary) hypertension: Secondary | ICD-10-CM

## 2021-07-01 DIAGNOSIS — I493 Ventricular premature depolarization: Secondary | ICD-10-CM

## 2021-07-01 NOTE — Progress Notes (Signed)
Dr Eldridge Dace would like patient to have Calcium Score CT Scan ?

## 2021-08-18 ENCOUNTER — Ambulatory Visit (INDEPENDENT_AMBULATORY_CARE_PROVIDER_SITE_OTHER)
Admission: RE | Admit: 2021-08-18 | Discharge: 2021-08-18 | Disposition: A | Discharge status: Home / Self Care | Payer: Self-pay | Source: Ambulatory Visit | Attending: Interventional Cardiology | Admitting: Interventional Cardiology

## 2021-08-18 DIAGNOSIS — I493 Ventricular premature depolarization: Secondary | ICD-10-CM

## 2021-08-18 DIAGNOSIS — I10 Essential (primary) hypertension: Secondary | ICD-10-CM

## 2021-08-19 ENCOUNTER — Encounter: Payer: Self-pay | Admitting: Interventional Cardiology

## 2021-08-19 ENCOUNTER — Telehealth: Payer: Self-pay | Admitting: *Deleted

## 2021-08-19 DIAGNOSIS — I700 Atherosclerosis of aorta: Secondary | ICD-10-CM

## 2021-08-19 DIAGNOSIS — Z79899 Other long term (current) drug therapy: Secondary | ICD-10-CM

## 2021-08-19 MED ORDER — ROSUVASTATIN CALCIUM 10 MG PO TABS: 10.0000 mg | ORAL_TABLET | Freq: Every day | ORAL | 3 refills | Status: DC

## 2021-08-19 NOTE — Telephone Encounter (Signed)
-----   Message from Corky Crafts, MD sent at 08/19/2021 10:48 AM EDT ----- Calcium score is 0.  No CAD.  Aortic atherosclerosis noted which is an indication for statin therapy.  Would recommend starting Crestor 10 mg daily.  Check liver and lipids in 3 months.

## 2021-08-19 NOTE — Telephone Encounter (Signed)
I spoke with patient and gave her information from pharmacist in the my chart message

## 2021-08-19 NOTE — Telephone Encounter (Signed)
   Pt is calling back requesting to speak with Dennie Bible regarding her new prescription

## 2021-08-19 NOTE — Telephone Encounter (Signed)
I spoke with patient and reviewed results/recommendations with her.  Prescription sent to [facility] in [place].  Patient will come to office for fasting lab work on November 19, 2021.  She will follow up with Dr Eldridge Dace in a year.

## 2021-09-09 ENCOUNTER — Other Ambulatory Visit: Payer: Self-pay | Admitting: Internal Medicine

## 2021-09-09 ENCOUNTER — Ambulatory Visit
Admission: RE | Admit: 2021-09-09 | Discharge: 2021-09-09 | Disposition: A | Discharge status: Home / Self Care | Payer: BC Managed Care – PPO | Source: Ambulatory Visit | Attending: Internal Medicine | Admitting: Internal Medicine

## 2021-09-09 DIAGNOSIS — R0789 Other chest pain: Secondary | ICD-10-CM

## 2021-09-12 ENCOUNTER — Other Ambulatory Visit: Payer: Self-pay | Admitting: Internal Medicine

## 2021-09-12 ENCOUNTER — Ambulatory Visit
Admission: RE | Admit: 2021-09-12 | Discharge: 2021-09-12 | Disposition: A | Discharge status: Home / Self Care | Payer: BC Managed Care – PPO | Source: Ambulatory Visit | Attending: Internal Medicine | Admitting: Internal Medicine

## 2021-09-12 DIAGNOSIS — M7918 Myalgia, other site: Secondary | ICD-10-CM

## 2021-09-24 ENCOUNTER — Encounter: Payer: Self-pay | Admitting: Obstetrics and Gynecology

## 2021-09-24 ENCOUNTER — Ambulatory Visit: Payer: BC Managed Care – PPO | Admitting: Obstetrics and Gynecology

## 2021-09-24 VITALS — BP 130/80 | Ht 61.0 in | Wt 157.0 lb

## 2021-09-24 DIAGNOSIS — R109 Unspecified abdominal pain: Secondary | ICD-10-CM | POA: Diagnosis not present

## 2021-09-24 LAB — URINALYSIS, COMPLETE W/RFL CULTURE
Bacteria, UA: NONE SEEN /HPF
Bilirubin Urine: NEGATIVE
Glucose, UA: NEGATIVE
Hgb urine dipstick: NEGATIVE
Hyaline Cast: NONE SEEN /LPF
Ketones, ur: NEGATIVE
Leukocyte Esterase: NEGATIVE
Nitrites, Initial: NEGATIVE
RBC / HPF: NONE SEEN /HPF (ref 0–2)
Specific Gravity, Urine: 1.025 (ref 1.001–1.035)
WBC, UA: NONE SEEN /HPF (ref 0–5)
pH: 5 (ref 5.0–8.0)

## 2021-09-24 LAB — NO CULTURE INDICATED

## 2021-09-24 NOTE — Progress Notes (Signed)
GYNECOLOGY  VISIT   HPI: 66 y.o.   Married White or Caucasian Not Hispanic or Latino  female   [phone] with Patient's last menstrual period was 03/03/2007 (approximate).   here for  abdominal cramping She c/o low abdominal/pelvic cramping x 2 days. Feels like she is going to start her cycle and like she has to have a BM. Pain is up to a 9/10 in severity. Normal BM qd, no change. No vaginal bleeding. No urine changes. She voids frequently, normal amounts, no pain.  Last colonoscopy was in 2019  No recent intercourse. Really painful secondary to atrophy. Tried estrogen last year, only 2-3 x because it was uncomfortable to insert.   GYNECOLOGIC HISTORY: Patient's last menstrual period was 03/03/2007 (approximate). Contraception:postmenopausal Menopausal hormone therapy: n/a        OB History     Gravida  3   Para  2   Term  2   Preterm      AB  1   Living  2      SAB  1   IAB      Ectopic      Multiple      Live Births                 Patient Active Problem List   Diagnosis Date Noted   Cervical spondylosis without myelopathy 01/28/2021   Generalized anxiety disorder 01/28/2021   History of adenomatous polyp of colon 01/28/2021   Liver cyst 01/28/2021   Mild intermittent asthma 01/28/2021   Moderate persistent asthma with (acute) exacerbation 01/28/2021   Polycystic kidney disease 01/28/2021   Arthritis of hand 01/28/2021   Vitamin D deficiency 01/28/2021   Adult polycystic kidney disease 06/26/2014   UNSPECIFIED TACHYCARDIA 11/19/2008   PALPITATIONS 11/19/2008   WRIST PAIN 12/19/2007   HIP PAIN 12/19/2007   HEADACHE 06/16/2007   HYPERTENSION 12/27/2006   ASTHMA 12/27/2006   NEUROPATHY, OTHER INFLAMMATORY AND TOXIC 09/22/2006    Past Medical History:  Diagnosis Date   ADPKD (autosomal dominant polycystic kidney)    Allergic rhinitis    Arthritis of hand    Asthma    Cervical radiculopathy due to osteoarthritis of spine    GAD (generalized  anxiety disorder)    History of adenomatous polyp of colon    Hypertension    Liver cyst    Moderate persistent reactive airway disease with acute exacerbation    Polycystic kidney    Polycystic kidney disease    followed at [facility], stage II PKD   Pruritus ani    PVC (premature ventricular contraction)    Renal stone    Spinal stenosis     Past Surgical History:  Procedure Laterality Date   AUGMENTATION MAMMAPLASTY Bilateral    BREAST ENHANCEMENT SURGERY  1999   DILATION AND CURETTAGE OF UTERUS  1992   missed AB   TUBAL LIGATION Bilateral 1993    Current Outpatient Medications  Medication Sig Dispense Refill   amLODipine (NORVASC) 5 MG tablet Take 5 mg by mouth daily.      betamethasone valerate ointment (VALISONE) 0.1 % Use a pea sized amount topically BID for 2 weeks, then use a pea sized amount one time a week. 45 g 1   calcium carbonate (OS-CAL) 600 MG TABS Take 600 mg by mouth daily.     cetirizine (ZYRTEC) 10 MG tablet Take 10 mg by mouth daily.     Cholecalciferol (D3 HIGH POTENCY) 50 MCG (2000 UT) CAPS Take  by mouth.     diclofenac Sodium (VOLTAREN) 1 % GEL Apply topically as needed.     LORazepam (ATIVAN) 0.5 MG tablet Take 0.5 mg by mouth as needed.     montelukast (SINGULAIR) 10 MG tablet Take 10 mg by mouth daily.  0   Multiple Vitamin (MULTIVITAMIN) capsule Take 1 capsule by mouth daily.     PROAIR HFA 108 (90 BASE) MCG/ACT inhaler as needed.     rosuvastatin (CRESTOR) 10 MG tablet Take 1 tablet (10 mg total) by mouth daily. 90 tablet 3   SYMBICORT 160-4.5 MCG/ACT inhaler Inhale 2 puffs into the lungs daily.      triamcinolone (NASACORT) 55 MCG/ACT AERO nasal inhaler 2 sprays daily as needed. nasal     VITAMIN D PO Take 2,000 Units by mouth.     No current facility-administered medications for this visit.     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    Employer: [employer]  Tobacco Use   Smoking status: Never   Smokeless tobacco: Never  Vaping Use   Vaping Use: Never used  Substance and Sexual Activity   Alcohol use: No   Drug use: No   Sexual activity: Yes    Partners: Male    Birth control/protection: Post-menopausal  Other Topics Concern   Not on file  Social History Narrative   Right handed   Social Determinants of Health   Financial Resource Strain: Not on file  Food Insecurity: Not on file  Transportation Needs: Not on file  Physical Activity: Not on file  Stress: Not on file  Social Connections: Not on file  Intimate Partner Violence: Not on file    ROS  PHYSICAL EXAMINATION:    BP 130/80 (BP Location: Right Arm, Patient Position: Sitting, Cuff Size: Normal)   Ht 5\' 1"  (1.549 m)   Wt 157 lb (71.2 kg)   LMP 03/03/2007 (Approximate)   BMI 29.66 kg/m     General appearance: alert, cooperative and appears stated age Abdomen: soft, non-tender; non distended, no masses,  no organomegaly  Pelvic: External genitalia:  no lesions              Urethra:  normal appearing urethra with no masses, tenderness or lesions              Bartholins and Skenes: normal                 Vagina: normal appearing vagina with normal color and discharge, no lesions              Cervix: no cervical motion tenderness and no lesions              Bimanual Exam:  Uterus:  normal size, contour, position, consistency, mobility,  non-tender  Adnexa: no mass, fullness, tenderness              Rectovaginal: Yes.  .  Confirms.              Anus:  normal sphincter tone, no lesions  Bladder: not tender  Pelvic floor: not significantly tender  Chaperone was present for exam.  1. Abdominal cramping No significant findings on exam - Urinalysis,Complete w/RFL Culture -Will set her up for an ultrasound, if negative she will f/u with her primary.

## 2021-09-25 ENCOUNTER — Telehealth: Payer: Self-pay | Admitting: *Deleted

## 2021-09-25 DIAGNOSIS — R109 Unspecified abdominal pain: Secondary | ICD-10-CM

## 2021-09-25 NOTE — Telephone Encounter (Signed)
-----   Message from Romualdo Bolk, MD sent at 09/24/2021  4:55 PM EDT ----- Please try and get her in for a gyn ultrasound in the next few days.  Thanks, Noreene Larsson

## 2021-09-25 NOTE — Telephone Encounter (Signed)
Spoke with [facility] Radiology Scheduling. PUS scheduled for 09/29/21 at 10am at [facility] at 0945 w/ full bladder.   Spoke with patient. Advised as seen above. Patient states she has no new symptoms since OV on 7/26 with Dr. Oscar La. Patient states she has researched her symptoms and states she may have cyst on her kidney. Patient has polycystic kidneys and is managed by Dr. Arrie Aran at [facility]. Advised patient if she has any concerns related to kidney she needs to contact managing provider, Dr. Arrie Aran for f/u. Advised patient I will update Dr. Oscar La and f/u with any additional recommendations. Patient states she is going to reach out to Dr. Arrie Aran today, wanted to confirm PUS appt first.    Routing to Dr. Oscar La to review.

## 2021-09-26 ENCOUNTER — Other Ambulatory Visit (HOSPITAL_BASED_OUTPATIENT_CLINIC_OR_DEPARTMENT_OTHER): Payer: Self-pay | Admitting: Nephrology

## 2021-09-26 DIAGNOSIS — Q613 Polycystic kidney, unspecified: Secondary | ICD-10-CM

## 2021-09-29 ENCOUNTER — Ambulatory Visit (HOSPITAL_BASED_OUTPATIENT_CLINIC_OR_DEPARTMENT_OTHER)
Admission: RE | Admit: 2021-09-29 | Discharge: 2021-09-29 | Disposition: A | Discharge status: Home / Self Care | Payer: BC Managed Care – PPO | Source: Ambulatory Visit | Attending: Obstetrics and Gynecology | Admitting: Obstetrics and Gynecology

## 2021-09-29 ENCOUNTER — Ambulatory Visit (HOSPITAL_BASED_OUTPATIENT_CLINIC_OR_DEPARTMENT_OTHER)
Admission: RE | Admit: 2021-09-29 | Discharge: 2021-09-29 | Disposition: A | Discharge status: Home / Self Care | Payer: BC Managed Care – PPO | Source: Ambulatory Visit | Attending: Nephrology | Admitting: Nephrology

## 2021-09-29 DIAGNOSIS — Q613 Polycystic kidney, unspecified: Secondary | ICD-10-CM | POA: Diagnosis present

## 2021-09-29 DIAGNOSIS — R109 Unspecified abdominal pain: Secondary | ICD-10-CM | POA: Diagnosis not present

## 2021-09-29 NOTE — Telephone Encounter (Signed)
Per review of Epic, patient is also scheduled for Renal US to follow PUS today at 11am.   Routing to Dr. Shirley Friar.

## 2021-09-30 ENCOUNTER — Telehealth: Payer: Self-pay | Admitting: *Deleted

## 2021-09-30 NOTE — Telephone Encounter (Signed)
Patient left message requesting return call with results of 09/29/21 PUS completed at [facility]. Renal US also completed.   Routing to Dr. Oscar La to review and advise.

## 2021-10-06 NOTE — Telephone Encounter (Signed)
Per review of Epic, patient notified of PUS results on 09/30/21.   Encounter closed.

## 2021-10-15 ENCOUNTER — Other Ambulatory Visit: Payer: Self-pay | Admitting: Obstetrics and Gynecology

## 2021-10-15 DIAGNOSIS — Z1231 Encounter for screening mammogram for malignant neoplasm of breast: Secondary | ICD-10-CM

## 2021-11-19 ENCOUNTER — Other Ambulatory Visit: Payer: BC Managed Care – PPO

## 2021-11-26 ENCOUNTER — Ambulatory Visit: Payer: BC Managed Care – PPO | Attending: Interventional Cardiology

## 2021-11-26 DIAGNOSIS — Z79899 Other long term (current) drug therapy: Secondary | ICD-10-CM

## 2021-11-26 DIAGNOSIS — I700 Atherosclerosis of aorta: Secondary | ICD-10-CM

## 2021-11-27 ENCOUNTER — Telehealth: Payer: Self-pay | Admitting: Interventional Cardiology

## 2021-11-27 LAB — HEPATIC FUNCTION PANEL
ALT: 23 IU/L (ref 0–32)
AST: 26 IU/L (ref 0–40)
Albumin: 4.4 g/dL (ref 3.9–4.9)
Alkaline Phosphatase: 91 IU/L (ref 44–121)
Bilirubin Total: 0.3 mg/dL (ref 0.0–1.2)
Bilirubin, Direct: 0.11 mg/dL (ref 0.00–0.40)
Total Protein: 6.4 g/dL (ref 6.0–8.5)

## 2021-11-27 LAB — LIPID PANEL
Chol/HDL Ratio: 1.8 ratio (ref 0.0–4.4)
Cholesterol, Total: 142 mg/dL (ref 100–199)
HDL: 78 mg/dL (ref >39)
LDL Chol Calc (NIH): 51 mg/dL (ref 0–99)
Triglycerides: 65 mg/dL (ref 0–149)
VLDL Cholesterol Cal: 13 mg/dL (ref 5–40)

## 2021-11-27 NOTE — Telephone Encounter (Signed)
Pt is calling to request her lab results to be sent to her PCP on file, after Dr. Irish Lack results them.  She is also asking for a hard copy to be mailed or faxed to her at [phone].  Pts PCP on file is Dr. Fara Olden with [facility].  Fax number to PCP office also provided in this message.   Will route this communication to Dr. Hassell Done RN for further fax/mailing management, once results are reviewed and advised on.

## 2021-11-27 NOTE — Telephone Encounter (Signed)
Pt is requesting her recent labs be mailed or faxed to her at: Fax # [phone]  And also to her PCP [facility]  Dr. Truman Hayward  Fax # [phone]

## 2021-12-02 NOTE — Telephone Encounter (Signed)
See lab results note.  Copy of labs has been mailed to patient. Results faxed to Dr Fara Olden

## 2022-01-01 ENCOUNTER — Ambulatory Visit: Payer: BC Managed Care – PPO

## 2022-01-20 NOTE — Progress Notes (Signed)
66 y.o. [id number] Married White or Caucasian Not Hispanic or Latino female here for annual exam.     She was noted to have vulvar irritation last year, biopsy wasn't definitive for lichen sclerosis. Chronic inflammation was noted. No current irritation, uses steroid ointment ~1 x a week.   Patient's last menstrual period was 03/03/2007 (approximate).          Sexually active: Yes.    The current method of family planning is post menopausal status.    Exercising: Yes.     Walking biking  Smoker:  no  Health Maintenance: Pap:  01/28/21 WNL Hr HPV Neg; 09-24-15 neg HPV HR neg History of abnormal Pap:  no MMG:  01/01/21 density B Bi-rads 1 neg. Scheduled next week.  BMD:   none  Colonoscopy: 11/2017 polyps. F/u 5 years. [facility] TDaP:  11/23/2012 Gardasil: n/a   reports that she has never smoked. She has never used smokeless tobacco. She reports that she does not drink alcohol and does not use drugs. Retired, 2 daughters, 2 grandsons (10 and 6).  Past Medical History:  Diagnosis Date   ADPKD (autosomal dominant polycystic kidney)    Allergic rhinitis    Arthritis of hand    Asthma    Cervical radiculopathy due to osteoarthritis of spine    GAD (generalized anxiety disorder)    History of adenomatous polyp of colon    Hypertension    Liver cyst    Moderate persistent reactive airway disease with acute exacerbation    Polycystic kidney    Polycystic kidney disease    followed at [facility], stage II PKD   Pruritus ani    PVC (premature ventricular contraction)    Renal stone    Spinal stenosis     Past Surgical History:  Procedure Laterality Date   AUGMENTATION MAMMAPLASTY Bilateral    BREAST ENHANCEMENT SURGERY  1999   DILATION AND CURETTAGE OF UTERUS  1992   missed AB   TUBAL LIGATION Bilateral 1993    Current Outpatient Medications  Medication Sig Dispense Refill   amLODipine (NORVASC) 5 MG tablet Take 5 mg by mouth daily.      betamethasone valerate ointment  (VALISONE) 0.1 % Use a pea sized amount topically BID for 2 weeks, then use a pea sized amount one time a week. 45 g 1   calcium carbonate (OS-CAL) 600 MG TABS Take 600 mg by mouth daily.     Cholecalciferol (D3 HIGH POTENCY) 50 MCG (2000 UT) CAPS Take by mouth.     diclofenac Sodium (VOLTAREN) 1 % GEL Apply topically as needed.     LORazepam (ATIVAN) 0.5 MG tablet Take 0.5 mg by mouth as needed.     montelukast (SINGULAIR) 10 MG tablet Take 10 mg by mouth daily.  0   Multiple Vitamin (MULTIVITAMIN) capsule Take 1 capsule by mouth daily.     PROAIR HFA 108 (90 BASE) MCG/ACT inhaler as needed.     rosuvastatin (CRESTOR) 10 MG tablet Take 1 tablet (10 mg total) by mouth daily. 90 tablet 3   SYMBICORT 160-4.5 MCG/ACT inhaler Inhale 2 puffs into the lungs daily.      triamcinolone (NASACORT) 55 MCG/ACT AERO nasal inhaler 2 sprays daily as needed. nasal     VITAMIN D PO Take 2,000 Units by mouth.     No current facility-administered medications for this visit.    Family History  Problem Relation Age of Onset   Hypertension Mother    Hyperlipidemia Mother  Stroke Mother    Hypertension Father    Cancer Maternal Aunt        breast   Breast cancer Maternal Aunt    Breast cancer Daughter        stage 3    Review of Systems  All other systems reviewed and are negative.   Exam:   BP 112/78   Pulse 66   Ht 5\' 1"  (1.549 m)   Wt 167 lb (75.8 kg)   LMP 03/03/2007 (Approximate)   BMI 31.55 kg/m   Weight change: @WEIGHTCHANGE @ Height:   Height: 5\' 1"  (154.9 cm)  Ht Readings from Last 3 Encounters:  01/29/22 5\' 1"  (1.549 m)  09/24/21 5\' 1"  (1.549 m)  01/30/21 5\' 1"  (1.549 m)    General appearance: alert, cooperative and appears stated age Head: Normocephalic, without obvious abnormality, atraumatic Neck: no adenopathy, supple, symmetrical, trachea midline and thyroid normal to inspection and palpation Lungs: clear to auscultation bilaterally Cardiovascular: regular rate and  rhythm Breasts: normal appearance, no masses or tenderness Abdomen: soft, non-tender; non distended,  no masses,  no organomegaly Extremities: extremities normal, atraumatic, no cyanosis or edema Skin: Skin color, texture, turgor normal. No rashes or lesions Lymph nodes: Cervical, supraclavicular, and axillary nodes normal. No abnormal inguinal nodes palpated Neurologic: Grossly normal   Pelvic: External genitalia:  no lesions, prior whitening has resolved, normal exam              Urethra:  normal appearing urethra with no masses, tenderness or lesions              Bartholins and Skenes: normal                 Vagina: normal appearing vagina with normal color and discharge, no lesions              Cervix: no lesions               Bimanual Exam:  Uterus:  normal size, contour, position, consistency, mobility, non-tender              Adnexa: no mass, fullness, tenderness               Rectovaginal: Confirms               Anus:  normal sphincter tone, no lesions  01/31/22 chaperoned for the exam.  1. Well woman exam Discussed breast self exam Discussed calcium and vit D intake Mammogram scheduled Colonoscopy next year Labs with primary No pap this year

## 2022-01-29 ENCOUNTER — Ambulatory Visit (INDEPENDENT_AMBULATORY_CARE_PROVIDER_SITE_OTHER): Payer: BC Managed Care – PPO | Admitting: Obstetrics and Gynecology

## 2022-01-29 ENCOUNTER — Ambulatory Visit: Payer: BC Managed Care – PPO

## 2022-01-29 ENCOUNTER — Encounter: Payer: Self-pay | Admitting: Obstetrics and Gynecology

## 2022-01-29 VITALS — BP 112/78 | HR 66 | Ht 61.0 in | Wt 167.0 lb

## 2022-01-29 DIAGNOSIS — Z01419 Encounter for gynecological examination (general) (routine) without abnormal findings: Secondary | ICD-10-CM | POA: Diagnosis not present

## 2022-01-29 NOTE — Patient Instructions (Signed)

## 2022-02-03 ENCOUNTER — Ambulatory Visit
Admission: RE | Admit: 2022-02-03 | Discharge: 2022-02-03 | Disposition: A | Discharge status: Home / Self Care | Payer: BC Managed Care – PPO | Source: Ambulatory Visit | Attending: Obstetrics and Gynecology | Admitting: Obstetrics and Gynecology

## 2022-02-03 DIAGNOSIS — Z1231 Encounter for screening mammogram for malignant neoplasm of breast: Secondary | ICD-10-CM

## 2022-03-02 DIAGNOSIS — M8580 Other specified disorders of bone density and structure, unspecified site: Secondary | ICD-10-CM

## 2022-03-02 HISTORY — DX: Other specified disorders of bone density and structure, unspecified site: M85.80

## 2022-07-06 LAB — LAB REPORT - SCANNED: EGFR: 83

## 2022-07-09 ENCOUNTER — Other Ambulatory Visit: Payer: Self-pay | Admitting: Nephrology

## 2022-07-09 DIAGNOSIS — N182 Chronic kidney disease, stage 2 (mild): Secondary | ICD-10-CM

## 2022-07-09 DIAGNOSIS — Q613 Polycystic kidney, unspecified: Secondary | ICD-10-CM

## 2022-07-21 ENCOUNTER — Encounter: Payer: Self-pay | Admitting: Obstetrics and Gynecology

## 2022-07-21 ENCOUNTER — Ambulatory Visit: Payer: Medicare PPO | Admitting: Obstetrics and Gynecology

## 2022-07-21 ENCOUNTER — Telehealth: Payer: Self-pay

## 2022-07-21 VITALS — BP 132/74 | HR 79 | Wt 168.0 lb

## 2022-07-21 DIAGNOSIS — N644 Mastodynia: Secondary | ICD-10-CM

## 2022-07-21 DIAGNOSIS — R0789 Other chest pain: Secondary | ICD-10-CM | POA: Diagnosis not present

## 2022-07-21 NOTE — Telephone Encounter (Signed)
Pt scheduled for 07/31/22 @ 150pm. Pt notified and voiced understanding. Will route to provider and close.

## 2022-07-21 NOTE — Patient Instructions (Addendum)
Chest Wall Pain Chest wall pain is pain in or around the bones and muscles of your chest. Chest wall pain may be caused by: An injury. Coughing a lot. Using your chest and arm muscles too much. Sometimes, the cause may not be known. This pain may take a few weeks or longer to get better. Follow these instructions at home: Managing pain, stiffness, and swelling If told, put ice on the painful area: Put ice in a plastic bag. Place a towel between your skin and the bag. Leave the ice on for 20 minutes, 2-3 times a day.  Activity Rest as told by your doctor. Avoid doing things that cause pain. This includes lifting heavy items. Ask your doctor what activities are safe for you. General instructions  Take over-the-counter and prescription medicines only as told by your doctor. Do not use any products that contain nicotine or tobacco, such as cigarettes, e-cigarettes, and chewing tobacco. If you need help quitting, ask your doctor. Keep all follow-up visits as told by your doctor. This is important. Contact a doctor if: You have a fever. Your chest pain gets worse. You have new symptoms. Get help right away if: You feel sick to your stomach (nauseous) or you throw up (vomit). You feel sweaty or light-headed. You have a cough with mucus from your lungs (sputum) or you cough up blood. You are short of breath. These symptoms may be an emergency. Do not wait to see if the symptoms will go away. Get medical help right away. Call your local emergency services (911 in the [place] Do not drive yourself to the hospital. Summary Chest wall pain is pain in or around the bones and muscles of your chest. It may be treated with ice, rest, and medicines. Your condition may also get better if you avoid doing things that cause pain. Contact a doctor if you have a fever, chest pain that gets worse, or new symptoms. Get help right away if you feel light-headed or you get short of breath. These symptoms may  be an emergency. This information is not intended to replace advice given to you by your health care provider. Make sure you discuss any questions you have with your health care provider. Document Revised: 05/21/2020 Document Reviewed: 05/03/2020 Elsevier Patient Education  2023 Elsevier Inc. Breast Tenderness Breast tenderness is a common problem for women of all ages, but may also occur in men. Breast tenderness has many possible causes, including hormone changes, infections, taking certain medicines, and caffeine intake. In women, the pain usually comes and goes with the menstrual cycle, but it can also be constant. Breast tenderness may range from mild discomfort to severe pain. You may have tests, such as a mammogram or an ultrasound, to check for any unusual findings. Having breast tenderness usually does not mean that you have breast cancer. Follow these instructions at home: Managing pain and discomfort  If directed, put ice on the painful area. To do this: Put ice in a plastic bag. Place a towel between your skin and the bag. Leave the ice on for 20 minutes, 2-3 times a day. If your skin turns bright red, remove the ice right away to prevent skin damage. The risk of skin damage is higher if you cannot feel pain, heat, or cold. Wear a supportive bra or chest support: During exercise. While sleeping, if your breasts are very tender. Medicines Take over-the-counter and prescription medicines only as told by your health care provider. If the cause of your  pain is an infection, you may be prescribed an antibiotic medicine. If you were prescribed antibiotics, take them as told by your health care provider. Do not stop using the antibiotic even if you start to feel better. Eating and drinking Decrease the amount of caffeine in your diet. Instead, drink more water and choose caffeine-free drinks. Your health care provider may recommend that you lessen the amount of fat in your diet. You can  do this by: Limiting fried foods. Cooking foods using methods such as baking, boiling, grilling, and broiling. General instructions  Keep a log of the days and times when your breasts are most tender. Ask your health care provider how to do breast exams at home. This will help you notice if you have an unusual growth or lump. Keep all follow-up visits. Contact a health care provider if: Any part of your breast is hard, red, and hot to the touch. This may be a sign of infection. You are a woman and have a new or painful lump in your breast that remains after your menstrual period ends. You are not breastfeeding and you have fluid, especially blood or pus, coming out of your nipples. You have a fever. Your pain does not improve or it gets worse. Your pain is interfering with your daily activities. Summary Breast tenderness may range from mild discomfort to severe pain. Breast tenderness has many possible causes, including hormone changes, infections, taking certain medicines, and caffeine intake. It can be treated with ice, wearing a supportive bra or chest support, and medicines. Make changes to your diet as told by your health care provider. This information is not intended to replace advice given to you by your health care provider. Make sure you discuss any questions you have with your health care provider. Document Revised: 04/30/2021 Document Reviewed: 04/30/2021 Elsevier Patient Education  2023 [employer].

## 2022-07-21 NOTE — Progress Notes (Signed)
GYNECOLOGY  VISIT   HPI: 67 y.o.   Married White or Caucasian Not Hispanic or Latino  female   [id number] with Patient's last menstrual period was 03/03/2007 (approximate).   here for chest wall pain.   She felt sore and inflamed in the supraclavicular region in April, lasted for a few days.  She has had intermittent pain in her right lateral chest wall that radiates to the upper right chest. Symptoms started in early April. Hurt more after cleaning. Mainly tender to touch. She goes to yoga 1 x a week.   She doesn't drink caffeine.  Mammogram in 02/04/23: cat b, bi-rads 1, negative.  GYNECOLOGIC HISTORY: Patient's last menstrual period was 03/03/2007 (approximate). Contraception:pmp Menopausal hormone therapy: none         OB History     Gravida  3   Para  2   Term  2   Preterm      AB  1   Living  2      SAB  1   IAB      Ectopic      Multiple      Live Births                 Patient Active Problem List   Diagnosis Date Noted   Cervical spondylosis without myelopathy 01/28/2021   Generalized anxiety disorder 01/28/2021   History of adenomatous polyp of colon 01/28/2021   Liver cyst 01/28/2021   Mild intermittent asthma 01/28/2021   Moderate persistent asthma with (acute) exacerbation 01/28/2021   Polycystic kidney disease 01/28/2021   Arthritis of hand 01/28/2021   Vitamin D deficiency 01/28/2021   Adult polycystic kidney disease 06/26/2014   UNSPECIFIED TACHYCARDIA 11/19/2008   PALPITATIONS 11/19/2008   WRIST PAIN 12/19/2007   HIP PAIN 12/19/2007   HEADACHE 06/16/2007   HYPERTENSION 12/27/2006   ASTHMA 12/27/2006   NEUROPATHY, OTHER INFLAMMATORY AND TOXIC 09/22/2006    Past Medical History:  Diagnosis Date   ADPKD (autosomal dominant polycystic kidney)    Allergic rhinitis    Arthritis of hand    Asthma    Cervical radiculopathy due to osteoarthritis of spine    GAD (generalized anxiety disorder)    History of adenomatous polyp of colon     Hypertension    Liver cyst    Moderate persistent reactive airway disease with acute exacerbation    Polycystic kidney    Polycystic kidney disease    followed at [facility], stage II PKD   Pruritus ani    PVC (premature ventricular contraction)    Renal stone    Spinal stenosis     Past Surgical History:  Procedure Laterality Date   AUGMENTATION MAMMAPLASTY Bilateral    BREAST ENHANCEMENT SURGERY  1999   DILATION AND CURETTAGE OF UTERUS  1992   missed AB   TUBAL LIGATION Bilateral 1993    Current Outpatient Medications  Medication Sig Dispense Refill   amLODipine (NORVASC) 5 MG tablet Take 5 mg by mouth daily.      betamethasone valerate ointment (VALISONE) 0.1 % Use a pea sized amount topically BID for 2 weeks, then use a pea sized amount one time a week. 45 g 1   calcium carbonate (OS-CAL) 600 MG TABS Take 600 mg by mouth daily.     Cholecalciferol (D3 HIGH POTENCY) 50 MCG (2000 UT) CAPS Take by mouth.     diclofenac Sodium (VOLTAREN) 1 % GEL Apply topically as needed.     LORazepam (  ATIVAN) 0.5 MG tablet Take 0.5 mg by mouth as needed.     montelukast (SINGULAIR) 10 MG tablet Take 10 mg by mouth daily.  0   Multiple Vitamin (MULTIVITAMIN) capsule Take 1 capsule by mouth daily.     PROAIR HFA 108 (90 BASE) MCG/ACT inhaler as needed.     rosuvastatin (CRESTOR) 10 MG tablet Take 1 tablet (10 mg total) by mouth daily. 90 tablet 3   SYMBICORT 160-4.5 MCG/ACT inhaler Inhale 2 puffs into the lungs daily.      triamcinolone (NASACORT) 55 MCG/ACT AERO nasal inhaler 2 sprays daily as needed. nasal     VITAMIN D PO Take 2,000 Units by mouth.     No current facility-administered medications for this visit.     ALLERGIES: Ace inhibitors, Cefuroxime axetil, Irbesartan, Prinivil [lisinopril], Sulfonamide derivatives, and Fexofenadine  Family History  Problem Relation Age of Onset   Hypertension Mother    Hyperlipidemia Mother    Stroke Mother    Hypertension Father     Cancer Maternal Aunt        breast   Breast cancer Maternal Aunt    Breast cancer Daughter        stage 3    Social History   Socioeconomic History   Marital status: Married    Spouse name: Not on file   Number of children: 2   Years of education: Not on file   Highest education level: Not on file  Occupational History   Occupation: DELIVERY SERVICE COORDINATOR    Employer: [employer]  Tobacco Use   Smoking status: Never   Smokeless tobacco: Never  Vaping Use   Vaping Use: Never used  Substance and Sexual Activity   Alcohol use: No   Drug use: No   Sexual activity: Yes    Partners: Male    Birth control/protection: Post-menopausal  Other Topics Concern   Not on file  Social History Narrative   Right handed   Social Determinants of Health   Financial Resource Strain: Not on file  Food Insecurity: Not on file  Transportation Needs: Not on file  Physical Activity: Not on file  Stress: Not on file  Social Connections: Not on file  Intimate Partner Violence: Not on file    Review of Systems  All other systems reviewed and are negative.   PHYSICAL EXAMINATION:    LMP 03/03/2007 (Approximate)     General appearance: alert, cooperative and appears stated age Breasts:  bilaterally soft implants. No masses or skin changes. She is mildly tender on the lateral right breast. More tender on the lateral right chest wall. Mildly tender in the right axilla, no lumps. No axillary or supraclavicular adenopathy.  1. Breast tenderness Take tylenol, use ice, avoid caffeine, make sure bra's fit well -Will set up diagnostic imaging     2. Chest wall tenderness Suspect this is the main source of her pain. She can't take NSAID's (h/o polycystic kidney disease). Take tylenol, ice the area, avoid activities that cause pain. Information given

## 2022-07-21 NOTE — Telephone Encounter (Signed)
-----   Message from Romualdo Bolk, MD sent at 07/21/2022  3:05 PM EDT ----- Please schedule her for diagnostic breast imaging on the right. She is having right breast pain. She goes to the breast center.  Thanks, Noreene Larsson

## 2022-07-22 ENCOUNTER — Ambulatory Visit
Admission: RE | Admit: 2022-07-22 | Discharge: 2022-07-22 | Disposition: A | Discharge status: Home / Self Care | Payer: Medicare PPO | Source: Ambulatory Visit | Attending: Nephrology | Admitting: Nephrology

## 2022-07-22 DIAGNOSIS — Q613 Polycystic kidney, unspecified: Secondary | ICD-10-CM

## 2022-07-22 DIAGNOSIS — N182 Chronic kidney disease, stage 2 (mild): Secondary | ICD-10-CM

## 2022-07-31 ENCOUNTER — Ambulatory Visit
Admission: RE | Admit: 2022-07-31 | Discharge: 2022-07-31 | Disposition: A | Discharge status: Home / Self Care | Payer: Medicare PPO | Source: Ambulatory Visit | Attending: Obstetrics and Gynecology

## 2022-07-31 ENCOUNTER — Other Ambulatory Visit: Payer: Self-pay | Admitting: Obstetrics and Gynecology

## 2022-07-31 ENCOUNTER — Ambulatory Visit
Admission: RE | Admit: 2022-07-31 | Discharge: 2022-07-31 | Disposition: A | Discharge status: Home / Self Care | Payer: Self-pay | Source: Ambulatory Visit | Attending: Obstetrics and Gynecology | Admitting: Obstetrics and Gynecology

## 2022-07-31 DIAGNOSIS — N644 Mastodynia: Secondary | ICD-10-CM

## 2022-08-04 ENCOUNTER — Other Ambulatory Visit: Payer: BC Managed Care – PPO

## 2022-08-07 ENCOUNTER — Other Ambulatory Visit: Payer: Self-pay | Admitting: Interventional Cardiology

## 2022-08-13 ENCOUNTER — Other Ambulatory Visit: Payer: Self-pay | Admitting: Orthopaedic Surgery

## 2022-08-13 DIAGNOSIS — M25511 Pain in right shoulder: Secondary | ICD-10-CM

## 2022-08-14 ENCOUNTER — Other Ambulatory Visit: Payer: Self-pay | Admitting: Internal Medicine

## 2022-08-14 DIAGNOSIS — E2839 Other primary ovarian failure: Secondary | ICD-10-CM

## 2022-08-22 ENCOUNTER — Other Ambulatory Visit: Payer: Medicare PPO

## 2022-08-29 ENCOUNTER — Ambulatory Visit
Admission: RE | Admit: 2022-08-29 | Discharge: 2022-08-29 | Disposition: A | Discharge status: Home / Self Care | Payer: Medicare PPO | Source: Ambulatory Visit | Attending: Orthopaedic Surgery | Admitting: Orthopaedic Surgery

## 2022-08-29 DIAGNOSIS — M25511 Pain in right shoulder: Secondary | ICD-10-CM

## 2022-11-18 DIAGNOSIS — R351 Nocturia: Secondary | ICD-10-CM | POA: Diagnosis not present

## 2022-11-18 DIAGNOSIS — N393 Stress incontinence (female) (male): Secondary | ICD-10-CM | POA: Diagnosis not present

## 2022-12-01 ENCOUNTER — Other Ambulatory Visit: Payer: Self-pay | Admitting: Obstetrics and Gynecology

## 2022-12-01 DIAGNOSIS — Z1231 Encounter for screening mammogram for malignant neoplasm of breast: Secondary | ICD-10-CM

## 2022-12-03 DIAGNOSIS — M19011 Primary osteoarthritis, right shoulder: Secondary | ICD-10-CM | POA: Diagnosis not present

## 2022-12-03 DIAGNOSIS — M24111 Other articular cartilage disorders, right shoulder: Secondary | ICD-10-CM | POA: Diagnosis not present

## 2022-12-03 DIAGNOSIS — G8918 Other acute postprocedural pain: Secondary | ICD-10-CM | POA: Diagnosis not present

## 2022-12-03 DIAGNOSIS — M7581 Other shoulder lesions, right shoulder: Secondary | ICD-10-CM | POA: Diagnosis not present

## 2022-12-03 DIAGNOSIS — M75111 Incomplete rotator cuff tear or rupture of right shoulder, not specified as traumatic: Secondary | ICD-10-CM | POA: Diagnosis not present

## 2022-12-25 DIAGNOSIS — J018 Other acute sinusitis: Secondary | ICD-10-CM | POA: Diagnosis not present

## 2023-01-06 DIAGNOSIS — M6281 Muscle weakness (generalized): Secondary | ICD-10-CM | POA: Diagnosis not present

## 2023-01-06 DIAGNOSIS — M25511 Pain in right shoulder: Secondary | ICD-10-CM | POA: Diagnosis not present

## 2023-01-06 DIAGNOSIS — Z4789 Encounter for other orthopedic aftercare: Secondary | ICD-10-CM | POA: Diagnosis not present

## 2023-01-06 DIAGNOSIS — M25611 Stiffness of right shoulder, not elsewhere classified: Secondary | ICD-10-CM | POA: Diagnosis not present

## 2023-01-11 DIAGNOSIS — Z4789 Encounter for other orthopedic aftercare: Secondary | ICD-10-CM | POA: Diagnosis not present

## 2023-01-11 DIAGNOSIS — M6281 Muscle weakness (generalized): Secondary | ICD-10-CM | POA: Diagnosis not present

## 2023-01-11 DIAGNOSIS — M25511 Pain in right shoulder: Secondary | ICD-10-CM | POA: Diagnosis not present

## 2023-01-11 DIAGNOSIS — M25611 Stiffness of right shoulder, not elsewhere classified: Secondary | ICD-10-CM | POA: Diagnosis not present

## 2023-01-13 DIAGNOSIS — D045 Carcinoma in situ of skin of trunk: Secondary | ICD-10-CM | POA: Diagnosis not present

## 2023-01-13 DIAGNOSIS — L814 Other melanin hyperpigmentation: Secondary | ICD-10-CM | POA: Diagnosis not present

## 2023-01-13 DIAGNOSIS — D225 Melanocytic nevi of trunk: Secondary | ICD-10-CM | POA: Diagnosis not present

## 2023-01-13 DIAGNOSIS — L578 Other skin changes due to chronic exposure to nonionizing radiation: Secondary | ICD-10-CM | POA: Diagnosis not present

## 2023-01-13 DIAGNOSIS — L910 Hypertrophic scar: Secondary | ICD-10-CM | POA: Diagnosis not present

## 2023-01-14 DIAGNOSIS — M25511 Pain in right shoulder: Secondary | ICD-10-CM | POA: Diagnosis not present

## 2023-01-14 DIAGNOSIS — Z4789 Encounter for other orthopedic aftercare: Secondary | ICD-10-CM | POA: Diagnosis not present

## 2023-01-14 DIAGNOSIS — M6281 Muscle weakness (generalized): Secondary | ICD-10-CM | POA: Diagnosis not present

## 2023-01-14 DIAGNOSIS — M25611 Stiffness of right shoulder, not elsewhere classified: Secondary | ICD-10-CM | POA: Diagnosis not present

## 2023-01-18 DIAGNOSIS — M25511 Pain in right shoulder: Secondary | ICD-10-CM | POA: Diagnosis not present

## 2023-01-18 DIAGNOSIS — M6281 Muscle weakness (generalized): Secondary | ICD-10-CM | POA: Diagnosis not present

## 2023-01-18 DIAGNOSIS — Z4789 Encounter for other orthopedic aftercare: Secondary | ICD-10-CM | POA: Diagnosis not present

## 2023-01-18 DIAGNOSIS — M25611 Stiffness of right shoulder, not elsewhere classified: Secondary | ICD-10-CM | POA: Diagnosis not present

## 2023-01-19 NOTE — Progress Notes (Signed)
67 y.o. G14P2012 Married Caucasian female here for annual exam.    Patient is followed for vulvar chronic inflammation on biopsy 01/30/21.  No dysplasia or cancer seen. No definitive lichen sclerosus.   Really wants a pap today.   Granddaughter born at [redacted] weeks gestation and is receiving care at [facility].  PCP: Lorenda Ishihara, MD   Patient's last menstrual period was 03/03/2007 (approximate).           Sexually active: Yes.    The current method of family planning is post menopausal status.    Menopausal hormone therapy:  n/a Exercising: Yes.     Walking, exercise bike, youtube low impact exercises Smoker:  no  OB History  Gravida Para Term Preterm AB Living  3 2 2   1 2   SAB IAB Ectopic Multiple Live Births  1            # Outcome Date GA Lbr Len/2nd Weight Sex Type Anes PTL Lv  3 SAB           2 Term           1 Term              HEALTH MAINTENANCE: Last 2 paps:  01/28/21 neg: HR HPV neg, 09/14/13 WNL History of abnormal Pap or positive HPV:  no Mammogram: scheduled 01/2023,   07/31/22 Breast Density Cat B, BI-RADS CAT 1 neg Colonoscopy:  11/2017 with [facility] Density:  scheduled 02/25/23 - PCP ordered, 01/14/16  Result  osteopenia    Immunization History  Administered Date(s) Administered   Influenza Split 01/07/2009, 11/29/2013, 12/29/2016, 11/23/2018, 12/19/2020   Influenza Whole 12/19/2007   Influenza,inj,Quad PF,6+ Mos 12/24/2015, 01/04/2018, 12/26/2019   Influenza,inj,quad, With Preservative 12/06/2014   Influenza-Unspecified 12/29/2016, 11/23/2018   PFIZER(Purple Top)SARS-COV-2 Vaccination 06/17/2019, 07/11/2019, 01/31/2020, 04/02/2020   Pneumococcal Polysaccharide-23 03/02/2002   Td 03/02/2002, 05/01/2002   Tdap 11/23/2012   Zoster Recombinant(Shingrix) 12/29/2016, 06/29/2017   Zoster, Live 12/29/2016      reports that she has never smoked. She has never used smokeless tobacco. She reports that she does not drink alcohol and does not use  drugs.  Past Medical History:  Diagnosis Date   ADPKD (autosomal dominant polycystic kidney)    Allergic rhinitis    Arthritis of hand    Asthma    Cervical radiculopathy due to osteoarthritis of spine    GAD (generalized anxiety disorder)    History of adenomatous polyp of colon    Hypertension    Liver cyst    Moderate persistent reactive airway disease with acute exacerbation    Polycystic kidney    Polycystic kidney disease    followed at [facility], stage II PKD   Pruritus ani    PVC (premature ventricular contraction)    Renal stone    Spinal stenosis     Past Surgical History:  Procedure Laterality Date   AUGMENTATION MAMMAPLASTY Bilateral    BREAST ENHANCEMENT SURGERY  1999   DILATION AND CURETTAGE OF UTERUS  1992   missed AB   TUBAL LIGATION Bilateral 1993    Current Outpatient Medications  Medication Sig Dispense Refill   amLODipine (NORVASC) 5 MG tablet Take 5 mg by mouth daily.      calcium carbonate (OS-CAL) 600 MG TABS Take 600 mg by mouth daily.     Cholecalciferol (D3 HIGH POTENCY) 50 MCG (2000 UT) CAPS Take by mouth.     diclofenac Sodium (VOLTAREN) 1 % GEL Apply topically  as needed.     montelukast (SINGULAIR) 10 MG tablet Take 10 mg by mouth daily.  0   Multiple Vitamin (MULTIVITAMIN) capsule Take 1 capsule by mouth daily.     PROAIR HFA 108 (90 BASE) MCG/ACT inhaler as needed.     rosuvastatin (CRESTOR) 10 MG tablet TAKE 1 TABLET BY MOUTH EVERY DAY 90 tablet 3   SYMBICORT 160-4.5 MCG/ACT inhaler Inhale 2 puffs into the lungs daily.      triamcinolone (NASACORT) 55 MCG/ACT AERO nasal inhaler 2 sprays daily as needed. nasal     VITAMIN D PO Take 2,000 Units by mouth.     No current facility-administered medications for this visit.    ALLERGIES: Ace inhibitors, Cefuroxime axetil, Irbesartan, Prinivil [lisinopril], Sulfonamide derivatives, and Fexofenadine  Family History  Problem Relation Age of Onset   Hypertension Mother    Hyperlipidemia  Mother    Stroke Mother    Hypertension Father    Cancer Maternal Aunt        breast   Breast cancer Maternal Aunt    Breast cancer Daughter        stage 3    Review of Systems  All other systems reviewed and are negative.   PHYSICAL EXAM:  BP 124/80 (BP Location: Left Arm, Patient Position: Sitting, Cuff Size: Normal)   Ht 5' 0.5" (1.537 m)   Wt 171 lb (77.6 kg)   LMP 03/03/2007 (Approximate)   BMI 32.85 kg/m     General appearance: alert, cooperative and appears stated age Head: normocephalic, without obvious abnormality, atraumatic Neck: no adenopathy, supple, symmetrical, trachea midline and thyroid normal to inspection and palpation Lungs: clear to auscultation bilaterally Breasts: bilateral implants present, normal appearance, no masses or tenderness, No nipple retraction or dimpling, No nipple discharge or bleeding, No axillary adenopathy Heart: regular rate and rhythm Abdomen: soft, non-tender; no masses, no organomegaly Extremities: extremities normal, atraumatic, no cyanosis or edema Skin: skin color, texture, turgor normal. No rashes or lesions Lymph nodes: cervical, supraclavicular, and axillary nodes normal. Neurologic: grossly normal  Pelvic: External genitalia:  no lesions              No abnormal inguinal nodes palpated.              Urethra:  normal appearing urethra with no masses, tenderness or lesions              Bartholins and Skenes: normal                 Vagina: normal appearing vagina with normal color and discharge, no lesions              Cervix: no lesions              Pap taken: Yes.   Bimanual Exam:  Uterus:  normal size, contour, position, consistency, mobility, non-tender              Adnexa: no mass, fullness, tenderness              Rectal exam: Yes.  .  Confirms.              Anus:  normal sphincter tone, no lesions  Chaperone was present for exam:  Warren Lacy, CMA  PHQ-9:  zero.  ASSESSMENT: Encounter for breast and pelvic exam.   Bilateral breast implants.  Hx vulvitis.  No sign of vulvitis today. Hx osteopenia.  PCP following.   PLAN: Mammogram screening discussed. Self breast awareness reviewed.  Pap and HRV collected:  Yes.   Guidelines for Calcium, Vitamin D, regular exercise program including cardiovascular and weight bearing exercise. Medication refills:  NA Follow up:  2 years and prn.

## 2023-01-21 DIAGNOSIS — M25611 Stiffness of right shoulder, not elsewhere classified: Secondary | ICD-10-CM | POA: Diagnosis not present

## 2023-01-21 DIAGNOSIS — M6281 Muscle weakness (generalized): Secondary | ICD-10-CM | POA: Diagnosis not present

## 2023-01-21 DIAGNOSIS — M25511 Pain in right shoulder: Secondary | ICD-10-CM | POA: Diagnosis not present

## 2023-01-21 DIAGNOSIS — Z4789 Encounter for other orthopedic aftercare: Secondary | ICD-10-CM | POA: Diagnosis not present

## 2023-01-25 DIAGNOSIS — M25611 Stiffness of right shoulder, not elsewhere classified: Secondary | ICD-10-CM | POA: Diagnosis not present

## 2023-01-25 DIAGNOSIS — M25511 Pain in right shoulder: Secondary | ICD-10-CM | POA: Diagnosis not present

## 2023-01-25 DIAGNOSIS — Z4789 Encounter for other orthopedic aftercare: Secondary | ICD-10-CM | POA: Diagnosis not present

## 2023-01-25 DIAGNOSIS — M6281 Muscle weakness (generalized): Secondary | ICD-10-CM | POA: Diagnosis not present

## 2023-01-27 DIAGNOSIS — Z4789 Encounter for other orthopedic aftercare: Secondary | ICD-10-CM | POA: Diagnosis not present

## 2023-01-27 DIAGNOSIS — M25611 Stiffness of right shoulder, not elsewhere classified: Secondary | ICD-10-CM | POA: Diagnosis not present

## 2023-01-27 DIAGNOSIS — M25511 Pain in right shoulder: Secondary | ICD-10-CM | POA: Diagnosis not present

## 2023-01-27 DIAGNOSIS — M6281 Muscle weakness (generalized): Secondary | ICD-10-CM | POA: Diagnosis not present
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
## 2023-02-01 DIAGNOSIS — M25611 Stiffness of right shoulder, not elsewhere classified: Secondary | ICD-10-CM | POA: Diagnosis not present

## 2023-02-01 DIAGNOSIS — M25511 Pain in right shoulder: Secondary | ICD-10-CM | POA: Diagnosis not present

## 2023-02-01 DIAGNOSIS — Z4789 Encounter for other orthopedic aftercare: Secondary | ICD-10-CM | POA: Diagnosis not present

## 2023-02-01 DIAGNOSIS — M25561 Pain in right knee: Secondary | ICD-10-CM | POA: Diagnosis not present

## 2023-02-01 DIAGNOSIS — M6281 Muscle weakness (generalized): Secondary | ICD-10-CM | POA: Diagnosis not present

## 2023-02-02 ENCOUNTER — Ambulatory Visit (INDEPENDENT_AMBULATORY_CARE_PROVIDER_SITE_OTHER): Payer: Medicare PPO | Admitting: Obstetrics and Gynecology

## 2023-02-02 ENCOUNTER — Encounter: Payer: Self-pay | Admitting: Obstetrics and Gynecology

## 2023-02-02 ENCOUNTER — Other Ambulatory Visit (HOSPITAL_COMMUNITY)
Admission: RE | Admit: 2023-02-02 | Discharge: 2023-02-02 | Disposition: A | Discharge status: Home / Self Care | Payer: Medicare PPO | Source: Ambulatory Visit | Attending: Obstetrics and Gynecology | Admitting: Obstetrics and Gynecology

## 2023-02-02 VITALS — BP 124/80 | Ht 60.5 in | Wt 171.0 lb

## 2023-02-02 DIAGNOSIS — Z1151 Encounter for screening for human papillomavirus (HPV): Secondary | ICD-10-CM | POA: Insufficient documentation

## 2023-02-02 DIAGNOSIS — N763 Subacute and chronic vulvitis: Secondary | ICD-10-CM

## 2023-02-02 DIAGNOSIS — Z124 Encounter for screening for malignant neoplasm of cervix: Secondary | ICD-10-CM

## 2023-02-02 DIAGNOSIS — Z01419 Encounter for gynecological examination (general) (routine) without abnormal findings: Secondary | ICD-10-CM

## 2023-02-02 NOTE — Patient Instructions (Signed)

## 2023-02-05 ENCOUNTER — Ambulatory Visit
Admission: RE | Admit: 2023-02-05 | Discharge: 2023-02-05 | Disposition: A | Discharge status: Home / Self Care | Payer: Medicare PPO | Source: Ambulatory Visit | Attending: Obstetrics and Gynecology | Admitting: Obstetrics and Gynecology

## 2023-02-05 DIAGNOSIS — Z1231 Encounter for screening mammogram for malignant neoplasm of breast: Secondary | ICD-10-CM

## 2023-02-05 LAB — CYTOLOGY - PAP
Comment: NEGATIVE
Diagnosis: NEGATIVE
High risk HPV: NEGATIVE

## 2023-02-08 DIAGNOSIS — I700 Atherosclerosis of aorta: Secondary | ICD-10-CM | POA: Diagnosis not present

## 2023-02-08 DIAGNOSIS — I10 Essential (primary) hypertension: Secondary | ICD-10-CM | POA: Diagnosis not present

## 2023-02-08 DIAGNOSIS — F411 Generalized anxiety disorder: Secondary | ICD-10-CM | POA: Diagnosis not present

## 2023-02-08 DIAGNOSIS — J4520 Mild intermittent asthma, uncomplicated: Secondary | ICD-10-CM | POA: Diagnosis not present

## 2023-02-10 ENCOUNTER — Ambulatory Visit: Payer: Medicare PPO | Attending: Cardiovascular Disease | Admitting: Cardiovascular Disease

## 2023-02-10 ENCOUNTER — Encounter: Payer: Self-pay | Admitting: Cardiovascular Disease

## 2023-02-10 VITALS — BP 130/80 | HR 66 | Ht 60.5 in | Wt 171.0 lb

## 2023-02-10 DIAGNOSIS — Z09 Encounter for follow-up examination after completed treatment for conditions other than malignant neoplasm: Secondary | ICD-10-CM

## 2023-02-10 DIAGNOSIS — I493 Ventricular premature depolarization: Secondary | ICD-10-CM

## 2023-02-10 DIAGNOSIS — I340 Nonrheumatic mitral (valve) insufficiency: Secondary | ICD-10-CM

## 2023-02-10 MED ORDER — ROSUVASTATIN CALCIUM 10 MG PO TABS: 10.0000 mg | ORAL_TABLET | Freq: Every day | ORAL | 3 refills | Status: DC

## 2023-02-10 NOTE — Patient Instructions (Addendum)
Medication Instructions:  Your physician recommends that you continue on your current medications as directed. Please refer to the Current Medication list given to you today.  *If you need a refill on your cardiac medications before your next appointment, please call your pharmacy*  Lab Work: If you have labs (blood work) drawn today and your tests are completely normal, you will receive your results only by: MyChart Message (if you have MyChart) OR A paper copy in the mail If you have any lab test that is abnormal or we need to change your treatment, we will call you to review the results.  Follow-Up: At [facility], you and your health needs are our priority.  As part of our continuing mission to provide you with exceptional heart care, we have created designated Provider Care Teams.  These Care Teams include your primary Cardiologist (physician) and Advanced Practice Providers (APPs -  Physician Assistants and Nurse Practitioners) who all work together to provide you with the care you need, when you need it.  We recommend signing up for the patient portal called "MyChart".  Sign up information is provided on this After Visit Summary.  MyChart is used to connect with patients for Virtual Visits (Telemedicine).  Patients are able to view lab/test results, encounter notes, upcoming appointments, etc.  Non-urgent messages can be sent to your provider as well.   To learn more about what you can do with MyChart, go to [email or web address].    Your next appointment:   1 year(s)  Provider:   Peter Nishan, MD     

## 2023-02-11 DIAGNOSIS — M25511 Pain in right shoulder: Secondary | ICD-10-CM | POA: Diagnosis not present

## 2023-02-11 DIAGNOSIS — M6281 Muscle weakness (generalized): Secondary | ICD-10-CM | POA: Diagnosis not present

## 2023-02-11 DIAGNOSIS — M25611 Stiffness of right shoulder, not elsewhere classified: Secondary | ICD-10-CM | POA: Diagnosis not present

## 2023-02-11 DIAGNOSIS — Z4789 Encounter for other orthopedic aftercare: Secondary | ICD-10-CM | POA: Diagnosis not present

## 2023-02-15 DIAGNOSIS — M25561 Pain in right knee: Secondary | ICD-10-CM | POA: Diagnosis not present

## 2023-02-25 ENCOUNTER — Ambulatory Visit
Admission: RE | Admit: 2023-02-25 | Discharge: 2023-02-25 | Disposition: A | Discharge status: Home / Self Care | Payer: Medicare PPO | Source: Ambulatory Visit | Attending: Internal Medicine | Admitting: Internal Medicine

## 2023-02-25 DIAGNOSIS — M8588 Other specified disorders of bone density and structure, other site: Secondary | ICD-10-CM | POA: Diagnosis not present

## 2023-02-25 DIAGNOSIS — N958 Other specified menopausal and perimenopausal disorders: Secondary | ICD-10-CM | POA: Diagnosis not present

## 2023-02-25 DIAGNOSIS — E2839 Other primary ovarian failure: Secondary | ICD-10-CM | POA: Diagnosis not present

## 2023-03-01 ENCOUNTER — Encounter: Payer: Self-pay | Admitting: Obstetrics and Gynecology

## 2023-03-10 DIAGNOSIS — M25561 Pain in right knee: Secondary | ICD-10-CM | POA: Diagnosis not present

## 2023-03-12 DIAGNOSIS — D485 Neoplasm of uncertain behavior of skin: Secondary | ICD-10-CM | POA: Diagnosis not present

## 2023-03-15 DIAGNOSIS — M6281 Muscle weakness (generalized): Secondary | ICD-10-CM | POA: Diagnosis not present

## 2023-03-15 DIAGNOSIS — R2689 Other abnormalities of gait and mobility: Secondary | ICD-10-CM | POA: Diagnosis not present

## 2023-03-15 DIAGNOSIS — M25561 Pain in right knee: Secondary | ICD-10-CM | POA: Diagnosis not present

## 2023-03-15 DIAGNOSIS — M25661 Stiffness of right knee, not elsewhere classified: Secondary | ICD-10-CM | POA: Diagnosis not present

## 2023-03-18 DIAGNOSIS — C44729 Squamous cell carcinoma of skin of left lower limb, including hip: Secondary | ICD-10-CM | POA: Diagnosis not present

## 2023-03-24 DIAGNOSIS — M25561 Pain in right knee: Secondary | ICD-10-CM | POA: Diagnosis not present

## 2023-03-24 DIAGNOSIS — R2689 Other abnormalities of gait and mobility: Secondary | ICD-10-CM | POA: Diagnosis not present

## 2023-03-24 DIAGNOSIS — M25661 Stiffness of right knee, not elsewhere classified: Secondary | ICD-10-CM | POA: Diagnosis not present

## 2023-03-24 DIAGNOSIS — M6281 Muscle weakness (generalized): Secondary | ICD-10-CM | POA: Diagnosis not present

## 2023-03-29 DIAGNOSIS — M25561 Pain in right knee: Secondary | ICD-10-CM | POA: Diagnosis not present

## 2023-03-29 DIAGNOSIS — M6281 Muscle weakness (generalized): Secondary | ICD-10-CM | POA: Diagnosis not present

## 2023-03-29 DIAGNOSIS — M25661 Stiffness of right knee, not elsewhere classified: Secondary | ICD-10-CM | POA: Diagnosis not present

## 2023-03-29 DIAGNOSIS — R2689 Other abnormalities of gait and mobility: Secondary | ICD-10-CM | POA: Diagnosis not present

## 2023-04-02 ENCOUNTER — Telehealth: Payer: Self-pay

## 2023-04-02 NOTE — Telephone Encounter (Signed)
Spoke w/ pt about her DXA results and she reported that she is having difficulty with a hip and desired to know which hip it was the she has the osteopenia in?  Per pt, the DXA reports looks to be as if she was only scanned on the left side and not the right. Pt reported that she remembered the tech only scanning her left arm due to the pt telling the tech that she was right handed but pt desires to know if only her left side in general was scanned and if so, why do they only check one side?  Please advise.

## 2023-04-05 NOTE — Telephone Encounter (Signed)
Both hips were scanned.  She has mild osteopenia of her left hip and her right hip is normal.  Her left wrist was scanned and is normal. Usually both wrists are not scanned.   Her fracture risk by a FRAX is low, and prescription medication to treat osteopenia is not indicated at this time.

## 2023-04-06 NOTE — Telephone Encounter (Signed)
Pt notified and voiced understanding. Encounter closed.

## 2023-04-07 DIAGNOSIS — U071 COVID-19: Secondary | ICD-10-CM | POA: Diagnosis not present

## 2023-04-07 DIAGNOSIS — J029 Acute pharyngitis, unspecified: Secondary | ICD-10-CM | POA: Diagnosis not present

## 2023-04-07 DIAGNOSIS — J069 Acute upper respiratory infection, unspecified: Secondary | ICD-10-CM | POA: Diagnosis not present

## 2023-04-07 DIAGNOSIS — J40 Bronchitis, not specified as acute or chronic: Secondary | ICD-10-CM | POA: Diagnosis not present

## 2023-04-23 DIAGNOSIS — Q612 Polycystic kidney, adult type: Secondary | ICD-10-CM | POA: Diagnosis not present

## 2023-04-23 DIAGNOSIS — K7689 Other specified diseases of liver: Secondary | ICD-10-CM | POA: Diagnosis not present

## 2023-04-23 DIAGNOSIS — Z860101 Personal history of adenomatous and serrated colon polyps: Secondary | ICD-10-CM | POA: Diagnosis not present

## 2023-05-07 DIAGNOSIS — L820 Inflamed seborrheic keratosis: Secondary | ICD-10-CM | POA: Diagnosis not present

## 2023-05-07 DIAGNOSIS — C44729 Squamous cell carcinoma of skin of left lower limb, including hip: Secondary | ICD-10-CM | POA: Diagnosis not present

## 2023-05-07 DIAGNOSIS — B079 Viral wart, unspecified: Secondary | ICD-10-CM | POA: Diagnosis not present

## 2023-05-11 DIAGNOSIS — R768 Other specified abnormal immunological findings in serum: Secondary | ICD-10-CM | POA: Diagnosis not present

## 2023-05-11 DIAGNOSIS — E669 Obesity, unspecified: Secondary | ICD-10-CM | POA: Diagnosis not present

## 2023-05-11 DIAGNOSIS — R5383 Other fatigue: Secondary | ICD-10-CM | POA: Diagnosis not present

## 2023-05-11 DIAGNOSIS — M542 Cervicalgia: Secondary | ICD-10-CM | POA: Diagnosis not present

## 2023-05-11 DIAGNOSIS — M25561 Pain in right knee: Secondary | ICD-10-CM | POA: Diagnosis not present

## 2023-05-11 DIAGNOSIS — M5459 Other low back pain: Secondary | ICD-10-CM | POA: Diagnosis not present

## 2023-05-11 DIAGNOSIS — Z6831 Body mass index (BMI) 31.0-31.9, adult: Secondary | ICD-10-CM | POA: Diagnosis not present

## 2023-05-11 DIAGNOSIS — M1991 Primary osteoarthritis, unspecified site: Secondary | ICD-10-CM | POA: Diagnosis not present

## 2023-05-27 DIAGNOSIS — M2241 Chondromalacia patellae, right knee: Secondary | ICD-10-CM | POA: Diagnosis not present

## 2023-05-27 DIAGNOSIS — S83231A Complex tear of medial meniscus, current injury, right knee, initial encounter: Secondary | ICD-10-CM | POA: Diagnosis not present

## 2023-05-27 DIAGNOSIS — S83281A Other tear of lateral meniscus, current injury, right knee, initial encounter: Secondary | ICD-10-CM | POA: Diagnosis not present

## 2023-05-27 DIAGNOSIS — S83271A Complex tear of lateral meniscus, current injury, right knee, initial encounter: Secondary | ICD-10-CM | POA: Diagnosis not present

## 2023-05-27 DIAGNOSIS — S83241A Other tear of medial meniscus, current injury, right knee, initial encounter: Secondary | ICD-10-CM | POA: Diagnosis not present

## 2023-05-27 DIAGNOSIS — G8918 Other acute postprocedural pain: Secondary | ICD-10-CM | POA: Diagnosis not present

## 2023-05-31 DIAGNOSIS — M25561 Pain in right knee: Secondary | ICD-10-CM | POA: Diagnosis not present

## 2023-05-31 DIAGNOSIS — M25661 Stiffness of right knee, not elsewhere classified: Secondary | ICD-10-CM | POA: Diagnosis not present

## 2023-05-31 DIAGNOSIS — Z4789 Encounter for other orthopedic aftercare: Secondary | ICD-10-CM | POA: Diagnosis not present

## 2023-05-31 DIAGNOSIS — M6281 Muscle weakness (generalized): Secondary | ICD-10-CM | POA: Diagnosis not present

## 2023-06-03 DIAGNOSIS — M6281 Muscle weakness (generalized): Secondary | ICD-10-CM | POA: Diagnosis not present

## 2023-06-03 DIAGNOSIS — Z4789 Encounter for other orthopedic aftercare: Secondary | ICD-10-CM | POA: Diagnosis not present

## 2023-06-03 DIAGNOSIS — M25561 Pain in right knee: Secondary | ICD-10-CM | POA: Diagnosis not present

## 2023-06-03 DIAGNOSIS — M25661 Stiffness of right knee, not elsewhere classified: Secondary | ICD-10-CM | POA: Diagnosis not present

## 2023-06-07 DIAGNOSIS — M25561 Pain in right knee: Secondary | ICD-10-CM | POA: Diagnosis not present

## 2023-06-07 DIAGNOSIS — M6281 Muscle weakness (generalized): Secondary | ICD-10-CM | POA: Diagnosis not present

## 2023-06-07 DIAGNOSIS — M25661 Stiffness of right knee, not elsewhere classified: Secondary | ICD-10-CM | POA: Diagnosis not present

## 2023-06-07 DIAGNOSIS — Z4789 Encounter for other orthopedic aftercare: Secondary | ICD-10-CM | POA: Diagnosis not present

## 2023-06-09 DIAGNOSIS — M6281 Muscle weakness (generalized): Secondary | ICD-10-CM | POA: Diagnosis not present

## 2023-06-09 DIAGNOSIS — Z4789 Encounter for other orthopedic aftercare: Secondary | ICD-10-CM | POA: Diagnosis not present

## 2023-06-09 DIAGNOSIS — M25561 Pain in right knee: Secondary | ICD-10-CM | POA: Diagnosis not present

## 2023-06-09 DIAGNOSIS — M25661 Stiffness of right knee, not elsewhere classified: Secondary | ICD-10-CM | POA: Diagnosis not present

## 2023-06-14 DIAGNOSIS — M25561 Pain in right knee: Secondary | ICD-10-CM | POA: Diagnosis not present

## 2023-06-14 DIAGNOSIS — Z4789 Encounter for other orthopedic aftercare: Secondary | ICD-10-CM | POA: Diagnosis not present

## 2023-06-14 DIAGNOSIS — M6281 Muscle weakness (generalized): Secondary | ICD-10-CM | POA: Diagnosis not present

## 2023-06-14 DIAGNOSIS — M25661 Stiffness of right knee, not elsewhere classified: Secondary | ICD-10-CM | POA: Diagnosis not present

## 2023-06-16 DIAGNOSIS — M25561 Pain in right knee: Secondary | ICD-10-CM | POA: Diagnosis not present

## 2023-06-16 DIAGNOSIS — M6281 Muscle weakness (generalized): Secondary | ICD-10-CM | POA: Diagnosis not present

## 2023-06-16 DIAGNOSIS — Z4789 Encounter for other orthopedic aftercare: Secondary | ICD-10-CM | POA: Diagnosis not present

## 2023-06-16 DIAGNOSIS — M25661 Stiffness of right knee, not elsewhere classified: Secondary | ICD-10-CM | POA: Diagnosis not present

## 2023-06-21 DIAGNOSIS — M25561 Pain in right knee: Secondary | ICD-10-CM | POA: Diagnosis not present

## 2023-06-21 DIAGNOSIS — M6281 Muscle weakness (generalized): Secondary | ICD-10-CM | POA: Diagnosis not present

## 2023-06-21 DIAGNOSIS — Z4789 Encounter for other orthopedic aftercare: Secondary | ICD-10-CM | POA: Diagnosis not present

## 2023-06-21 DIAGNOSIS — M25661 Stiffness of right knee, not elsewhere classified: Secondary | ICD-10-CM | POA: Diagnosis not present

## 2023-06-22 DIAGNOSIS — Z860100 Personal history of colon polyps, unspecified: Secondary | ICD-10-CM | POA: Diagnosis not present

## 2023-06-22 DIAGNOSIS — K5730 Diverticulosis of large intestine without perforation or abscess without bleeding: Secondary | ICD-10-CM | POA: Diagnosis not present

## 2023-06-22 DIAGNOSIS — Z09 Encounter for follow-up examination after completed treatment for conditions other than malignant neoplasm: Secondary | ICD-10-CM | POA: Diagnosis not present

## 2023-06-24 DIAGNOSIS — M25561 Pain in right knee: Secondary | ICD-10-CM | POA: Diagnosis not present

## 2023-06-24 DIAGNOSIS — Z4789 Encounter for other orthopedic aftercare: Secondary | ICD-10-CM | POA: Diagnosis not present

## 2023-06-24 DIAGNOSIS — M25661 Stiffness of right knee, not elsewhere classified: Secondary | ICD-10-CM | POA: Diagnosis not present

## 2023-06-24 DIAGNOSIS — M6281 Muscle weakness (generalized): Secondary | ICD-10-CM | POA: Diagnosis not present

## 2023-06-30 DIAGNOSIS — M542 Cervicalgia: Secondary | ICD-10-CM | POA: Diagnosis not present

## 2023-06-30 DIAGNOSIS — M6281 Muscle weakness (generalized): Secondary | ICD-10-CM | POA: Diagnosis not present

## 2023-06-30 DIAGNOSIS — R768 Other specified abnormal immunological findings in serum: Secondary | ICD-10-CM | POA: Diagnosis not present

## 2023-06-30 DIAGNOSIS — Z4789 Encounter for other orthopedic aftercare: Secondary | ICD-10-CM | POA: Diagnosis not present

## 2023-06-30 DIAGNOSIS — M25661 Stiffness of right knee, not elsewhere classified: Secondary | ICD-10-CM | POA: Diagnosis not present

## 2023-06-30 DIAGNOSIS — M1991 Primary osteoarthritis, unspecified site: Secondary | ICD-10-CM | POA: Diagnosis not present

## 2023-06-30 DIAGNOSIS — M5459 Other low back pain: Secondary | ICD-10-CM | POA: Diagnosis not present

## 2023-06-30 DIAGNOSIS — M25561 Pain in right knee: Secondary | ICD-10-CM | POA: Diagnosis not present

## 2023-07-07 DIAGNOSIS — Z4789 Encounter for other orthopedic aftercare: Secondary | ICD-10-CM | POA: Diagnosis not present

## 2023-07-07 DIAGNOSIS — M6281 Muscle weakness (generalized): Secondary | ICD-10-CM | POA: Diagnosis not present

## 2023-07-07 DIAGNOSIS — M25561 Pain in right knee: Secondary | ICD-10-CM | POA: Diagnosis not present

## 2023-07-07 DIAGNOSIS — M25661 Stiffness of right knee, not elsewhere classified: Secondary | ICD-10-CM | POA: Diagnosis not present

## 2023-07-08 DIAGNOSIS — R768 Other specified abnormal immunological findings in serum: Secondary | ICD-10-CM | POA: Diagnosis not present

## 2023-07-08 DIAGNOSIS — I129 Hypertensive chronic kidney disease with stage 1 through stage 4 chronic kidney disease, or unspecified chronic kidney disease: Secondary | ICD-10-CM | POA: Diagnosis not present

## 2023-07-08 DIAGNOSIS — Z87442 Personal history of urinary calculi: Secondary | ICD-10-CM | POA: Diagnosis not present

## 2023-07-08 DIAGNOSIS — Q612 Polycystic kidney, adult type: Secondary | ICD-10-CM | POA: Diagnosis not present

## 2023-07-08 DIAGNOSIS — N182 Chronic kidney disease, stage 2 (mild): Secondary | ICD-10-CM | POA: Diagnosis not present

## 2023-07-08 DIAGNOSIS — E66811 Obesity, class 1: Secondary | ICD-10-CM | POA: Diagnosis not present

## 2023-07-10 LAB — LAB REPORT - SCANNED
Albumin, Urine POC: 29.3
Creatinine, POC: 84.8 mg/dL
Microalb Creat Ratio: 35

## 2023-08-12 DIAGNOSIS — Z79899 Other long term (current) drug therapy: Secondary | ICD-10-CM | POA: Diagnosis not present

## 2023-08-12 DIAGNOSIS — E7800 Pure hypercholesterolemia, unspecified: Secondary | ICD-10-CM | POA: Diagnosis not present

## 2023-08-12 DIAGNOSIS — Q612 Polycystic kidney, adult type: Secondary | ICD-10-CM | POA: Diagnosis not present

## 2023-08-12 DIAGNOSIS — Z0000 Encounter for general adult medical examination without abnormal findings: Secondary | ICD-10-CM | POA: Diagnosis not present

## 2023-08-12 DIAGNOSIS — Z1389 Encounter for screening for other disorder: Secondary | ICD-10-CM | POA: Diagnosis not present

## 2023-08-12 DIAGNOSIS — I10 Essential (primary) hypertension: Secondary | ICD-10-CM | POA: Diagnosis not present

## 2023-08-12 DIAGNOSIS — J4520 Mild intermittent asthma, uncomplicated: Secondary | ICD-10-CM | POA: Diagnosis not present

## 2023-08-12 DIAGNOSIS — J309 Allergic rhinitis, unspecified: Secondary | ICD-10-CM | POA: Diagnosis not present

## 2023-08-12 DIAGNOSIS — M85851 Other specified disorders of bone density and structure, right thigh: Secondary | ICD-10-CM | POA: Diagnosis not present

## 2023-08-12 DIAGNOSIS — E559 Vitamin D deficiency, unspecified: Secondary | ICD-10-CM | POA: Diagnosis not present

## 2023-08-13 DIAGNOSIS — D225 Melanocytic nevi of trunk: Secondary | ICD-10-CM | POA: Diagnosis not present

## 2023-08-13 DIAGNOSIS — D2239 Melanocytic nevi of other parts of face: Secondary | ICD-10-CM | POA: Diagnosis not present

## 2023-08-13 DIAGNOSIS — L820 Inflamed seborrheic keratosis: Secondary | ICD-10-CM | POA: Diagnosis not present

## 2023-08-16 DIAGNOSIS — M25561 Pain in right knee: Secondary | ICD-10-CM | POA: Diagnosis not present

## 2023-08-20 DIAGNOSIS — M545 Low back pain, unspecified: Secondary | ICD-10-CM | POA: Diagnosis not present

## 2023-09-06 DIAGNOSIS — M25561 Pain in right knee: Secondary | ICD-10-CM | POA: Diagnosis not present

## 2023-09-06 DIAGNOSIS — M545 Low back pain, unspecified: Secondary | ICD-10-CM | POA: Diagnosis not present

## 2023-09-16 DIAGNOSIS — M25561 Pain in right knee: Secondary | ICD-10-CM | POA: Diagnosis not present

## 2023-09-17 DIAGNOSIS — M47816 Spondylosis without myelopathy or radiculopathy, lumbar region: Secondary | ICD-10-CM | POA: Diagnosis not present

## 2023-09-23 DIAGNOSIS — M1711 Unilateral primary osteoarthritis, right knee: Secondary | ICD-10-CM | POA: Diagnosis not present

## 2023-09-28 DIAGNOSIS — M47816 Spondylosis without myelopathy or radiculopathy, lumbar region: Secondary | ICD-10-CM | POA: Diagnosis not present

## 2023-10-20 ENCOUNTER — Other Ambulatory Visit: Payer: Self-pay | Admitting: Internal Medicine

## 2023-10-20 DIAGNOSIS — Z1231 Encounter for screening mammogram for malignant neoplasm of breast: Secondary | ICD-10-CM

## 2023-10-25 DIAGNOSIS — M1711 Unilateral primary osteoarthritis, right knee: Secondary | ICD-10-CM | POA: Diagnosis not present

## 2023-11-05 DIAGNOSIS — M1711 Unilateral primary osteoarthritis, right knee: Secondary | ICD-10-CM | POA: Diagnosis not present

## 2023-11-12 DIAGNOSIS — M1711 Unilateral primary osteoarthritis, right knee: Secondary | ICD-10-CM | POA: Diagnosis not present

## 2023-11-19 DIAGNOSIS — M1711 Unilateral primary osteoarthritis, right knee: Secondary | ICD-10-CM | POA: Diagnosis not present

## 2023-11-26 DIAGNOSIS — J329 Chronic sinusitis, unspecified: Secondary | ICD-10-CM | POA: Diagnosis not present

## 2023-11-26 DIAGNOSIS — I499 Cardiac arrhythmia, unspecified: Secondary | ICD-10-CM | POA: Diagnosis not present

## 2023-12-03 DIAGNOSIS — M533 Sacrococcygeal disorders, not elsewhere classified: Secondary | ICD-10-CM | POA: Diagnosis not present

## 2024-01-07 DIAGNOSIS — M25551 Pain in right hip: Secondary | ICD-10-CM | POA: Diagnosis not present

## 2024-01-07 DIAGNOSIS — M533 Sacrococcygeal disorders, not elsewhere classified: Secondary | ICD-10-CM | POA: Diagnosis not present

## 2024-01-09 ENCOUNTER — Other Ambulatory Visit: Payer: Self-pay

## 2024-01-09 ENCOUNTER — Emergency Department (HOSPITAL_COMMUNITY)

## 2024-01-09 ENCOUNTER — Encounter (HOSPITAL_COMMUNITY): Payer: Self-pay

## 2024-01-09 ENCOUNTER — Emergency Department (HOSPITAL_COMMUNITY)
Admission: EM | Admit: 2024-01-09 | Discharge: 2024-01-09 | Disposition: A | Discharge status: Home / Self Care | Attending: Emergency Medicine | Admitting: Emergency Medicine

## 2024-01-09 DIAGNOSIS — J45909 Unspecified asthma, uncomplicated: Secondary | ICD-10-CM | POA: Diagnosis not present

## 2024-01-09 DIAGNOSIS — K449 Diaphragmatic hernia without obstruction or gangrene: Secondary | ICD-10-CM | POA: Diagnosis not present

## 2024-01-09 DIAGNOSIS — R1011 Right upper quadrant pain: Secondary | ICD-10-CM | POA: Diagnosis not present

## 2024-01-09 DIAGNOSIS — N200 Calculus of kidney: Secondary | ICD-10-CM | POA: Diagnosis not present

## 2024-01-09 DIAGNOSIS — K5900 Constipation, unspecified: Secondary | ICD-10-CM | POA: Diagnosis not present

## 2024-01-09 DIAGNOSIS — Z79899 Other long term (current) drug therapy: Secondary | ICD-10-CM | POA: Diagnosis not present

## 2024-01-09 DIAGNOSIS — I700 Atherosclerosis of aorta: Secondary | ICD-10-CM | POA: Insufficient documentation

## 2024-01-09 DIAGNOSIS — I10 Essential (primary) hypertension: Secondary | ICD-10-CM | POA: Insufficient documentation

## 2024-01-09 DIAGNOSIS — K7689 Other specified diseases of liver: Secondary | ICD-10-CM | POA: Diagnosis not present

## 2024-01-09 DIAGNOSIS — N289 Disorder of kidney and ureter, unspecified: Secondary | ICD-10-CM | POA: Diagnosis not present

## 2024-01-09 DIAGNOSIS — Q446 Cystic disease of liver: Secondary | ICD-10-CM | POA: Diagnosis not present

## 2024-01-09 LAB — COMPREHENSIVE METABOLIC PANEL WITH GFR
ALT: 22 U/L (ref 0–44)
AST: 24 U/L (ref 15–41)
Albumin: 3.9 g/dL (ref 3.5–5.0)
Alkaline Phosphatase: 63 U/L (ref 38–126)
Anion gap: 9 (ref 5–15)
BUN: 14 mg/dL (ref 8–23)
CO2: 27 mmol/L (ref 22–32)
Calcium: 10.1 mg/dL (ref 8.9–10.3)
Chloride: 103 mmol/L (ref 98–111)
Creatinine, Ser: 0.77 mg/dL (ref 0.44–1.00)
GFR, Estimated: >60 mL/min (ref >60)
Glucose, Bld: 89 mg/dL (ref 70–99)
Potassium: 4.1 mmol/L (ref 3.5–5.1)
Sodium: 139 mmol/L (ref 135–145)
Total Bilirubin: 0.6 mg/dL (ref 0.0–1.2)
Total Protein: 6.7 g/dL (ref 6.5–8.1)

## 2024-01-09 LAB — LIPASE, BLOOD: Lipase: 31 U/L (ref 11–51)

## 2024-01-09 LAB — URINALYSIS, ROUTINE W REFLEX MICROSCOPIC
Bilirubin Urine: NEGATIVE
Glucose, UA: NEGATIVE mg/dL
Hgb urine dipstick: NEGATIVE
Ketones, ur: NEGATIVE mg/dL
Leukocytes,Ua: NEGATIVE
Nitrite: NEGATIVE
Protein, ur: NEGATIVE mg/dL
Specific Gravity, Urine: 1.009 (ref 1.005–1.030)
pH: 8 (ref 5.0–8.0)

## 2024-01-09 LAB — CBC
HCT: 42.6 % (ref 36.0–46.0)
Hemoglobin: 13.7 g/dL (ref 12.0–15.0)
MCH: 27.4 pg (ref 26.0–34.0)
MCHC: 32.2 g/dL (ref 30.0–36.0)
MCV: 85.2 fL (ref 80.0–100.0)
Platelets: 234 K/uL (ref 150–400)
RBC: 5 MIL/uL (ref 3.87–5.11)
RDW: 14 % (ref 11.5–15.5)
WBC: 7.5 K/uL (ref 4.0–10.5)
nRBC: 0 % (ref 0.0–0.2)

## 2024-01-09 MED ORDER — IOHEXOL 350 MG/ML SOLN
75.0000 mL | Freq: Once | INTRAVENOUS | Status: AC | PRN
  Administered 2024-01-09: 75 mL via INTRAVENOUS

## 2024-01-09 NOTE — ED Provider Notes (Signed)
 [place] EMERGENCY DEPARTMENT AT [facility] Provider Note   CSN: [id number] Arrival date & time: 01/09/24  1154     Patient presents with: Abdominal Pain   [name] [name] is a 68 y.o. female.   Patient with history of PKD, hypertension, asthma, kidney stones presents today with complaints of abdominal pain. Reports that pain began yesterday morning and has been persistent since then. Pain is in her right upper abdomen and into her right side. Denies history of similar pain previously. No history of abdominal surgeries. Denies nausea, vomiting, or diarrhea. She has been having regular bowel movements and passing flatus. Does report yesterdays bowel movement was lighter than normal. No fevers or chills. Pain is not worse with deep breathing, denies any chest pain or shortness of breath. No cough or congestion.    The history is provided by the patient. No language interpreter was used.  Abdominal Pain      Prior to Admission medications   Medication Sig Start Date End Date Taking? Authorizing Provider  amLODipine (NORVASC) 5 MG tablet Take 5 mg by mouth daily.  11/17/10   [provider]  calcium  carbonate (OS-CAL) 600 MG TABS Take 600 mg by mouth daily.    [provider]  Cholecalciferol (D3 HIGH POTENCY) 50 MCG (2000 UT) CAPS Take by mouth.    [provider]  diclofenac Sodium (VOLTAREN) 1 % GEL Apply topically as needed.    [provider]  montelukast (SINGULAIR) 10 MG tablet Take 10 mg by mouth daily. 11/19/17   [provider]  Multiple Vitamin (MULTIVITAMIN) capsule Take 1 capsule by mouth daily.    [provider]  PROAIR  HFA 108 (90 BASE) MCG/ACT inhaler as needed. 02/06/11   [provider]  rosuvastatin  (CRESTOR ) 10 MG tablet Take 1 tablet (10 mg total) by mouth daily. 02/10/23   Nishan, Peter C, MD  SYMBICORT 160-4.5 MCG/ACT inhaler Inhale 2 puffs into the lungs daily.  02/05/11   [provider]  triamcinolone  (NASACORT) 55 MCG/ACT AERO nasal inhaler 2 sprays daily as needed. nasal 07/21/06   [provider]  VITAMIN D PO Take 2,000 Units by mouth.    [provider]    Allergies: Ace inhibitors, Cefuroxime axetil, Irbesartan, Prinivil [lisinopril], Sulfonamide derivatives, and Fexofenadine    Review of Systems  Gastrointestinal:  Positive for abdominal pain.  All other systems reviewed and are negative.   Updated Vital Signs BP 138/78   Pulse 70   Temp 98.1 F (36.7 C)   Resp 16   Ht 5' 1 (1.549 m)   Wt 72.6 kg   LMP 03/03/2007 (Approximate)   SpO2 95%   BMI 30.23 kg/m   Physical Exam Vitals and nursing note reviewed.  Constitutional:      General: She is not in acute distress.    Appearance: Normal appearance. She is normal weight. She is not ill-appearing, toxic-appearing or diaphoretic.  HENT:     Head: Normocephalic and atraumatic.  Cardiovascular:     Rate and Rhythm: Normal rate.  Pulmonary:     Effort: Pulmonary effort is normal. No respiratory distress.  Abdominal:     General: Abdomen is flat.     Palpations: Abdomen is soft.     Tenderness: There is abdominal tenderness in the right upper quadrant and epigastric area. There is no guarding or rebound. Negative signs include Murphy's sign.  Musculoskeletal:        General: Normal range of motion.  Cervical back: Normal range of motion.  Skin:    General: Skin is warm and dry.     Findings: No rash.  Neurological:     General: No focal deficit present.     Mental Status: She is alert.  Psychiatric:        Mood and Affect: Mood normal.        Behavior: Behavior normal.     (all labs ordered are listed, but only abnormal results are displayed) Labs Reviewed  URINALYSIS, ROUTINE W REFLEX MICROSCOPIC - Abnormal; Notable for the following components:      Result Value   APPearance HAZY (*)    All other components within normal limits  LIPASE, BLOOD   COMPREHENSIVE METABOLIC PANEL WITH GFR  CBC    EKG: None  Radiology: US  Abdomen Limited RUQ (LIVER/GB) Result Date: 01/09/2024 EXAM: Right Upper Quadrant Abdominal Ultrasound 01/09/2024 05:54:01 PM TECHNIQUE: Real-time ultrasonography of the right upper quadrant of the abdomen was performed. COMPARISON: Comparison with study dated 01/09/2024. CLINICAL HISTORY: RUQ abdominal pain. FINDINGS: LIVER: The liver is multicystic. In the right hepatic lobe, there is a 2.4 x 3.0 x 2.1 cm described echogenic focus compatible with hemangioma as seen on same day CT abdomen and pelvis. Patent portal vein with antegrade flow. No intrahepatic biliary ductal dilatation. BILIARY SYSTEM: The extrahepatic biliary tree is not well visualized. The common bile duct measures approximately 3 mm. No pericholecystic fluid or wall thickening. No cholelithiasis. Negative sonographic Murphy's sign. OTHER: Polycystic right kidney. IMPRESSION: 1. No acute abnormality. 2. Polycystic liver / kidney disease. Electronically signed by: Norman Gatlin MD 01/09/2024 06:17 PM EST RP Workstation: [id number]   CT ABDOMEN PELVIS W CONTRAST Result Date: 01/09/2024 EXAM: CT ABDOMEN AND PELVIS WITH CONTRAST 01/09/2024 02:50:12 PM TECHNIQUE: CT of the abdomen and pelvis was performed with the administration of 75 mL of iohexol  (OMNIPAQUE ) 350 MG/ML injection. Multiplanar reformatted images are provided for review. Automated exposure control, iterative reconstruction, and/or weight-based adjustment of the mA/kV was utilized to reduce the radiation dose to as low as reasonably achievable. COMPARISON: Renal ultrasound 07/22/2022. Most recent CT of 09/21/2013. CLINICAL HISTORY: Abdominal pain, acute, nonlocalized. FINDINGS: LOWER CHEST: Normal heart size. Clear lung bases. LIVER: Innumerable hepatic cysts, greater left than right. A peripherally enhancing high right hepatic lobe 2.7 cm lesion on image 14 / 2 is consistent with a hemangioma. Dominant  left hepatic lobe hemangioma on the prior exam is no longer identified. No intrahepatic biliary duct dilatation. GALLBLADDER AND BILE DUCTS: The gallbladder is likely identified on image 32 / 2, without acute cholecystitis or biliary duct dilatation. SPLEEN: Normal in size and morphology. PANCREAS: Normal, without duct dilatation or acute inflammation. ADRENAL GLANDS: No acute abnormality. KIDNEYS, URETERS AND BLADDER: Innumerable cysts throughout both kidneys. Dominant lower pole right renal cyst measures 9.3 cm. At least 1 left renal lesion demonstrates complexity including at 8 mm on image 16 / 2. Punctate lower pole right renal collecting system calculi. No hydronephrosis. Normal urinary bladder. GI AND BOWEL: Normal stomach, without wall thickening. Normal small bowel caliber. Colonic stool burden suggests constipation. Normal appendix including image 59 / 2. Tiny hiatal hernia. There is no bowel obstruction. PERITONEUM AND RETROPERITONEUM: No ascites. No free air. VASCULATURE: Aorta is normal in caliber. Aortic atherosclerosis. Prominent gonadal veins as can be seen with pelvic congestion syndrome. LYMPH NODES: No lymphadenopathy. REPRODUCTIVE ORGANS: Normal uterus, without adnexal mass. Mild pelvic floor laxity. BONES AND SOFT TISSUES: Mild left convex lumbar spine curvature with lumbar  spondylosis. Incidental image breast implants. No focal soft tissue abnormality. IMPRESSION: 1. Polycystic liver/kidney disease. No acute superimposed process. 2. Colonic stool burden suggests constipation. No other explanation for pain. 3. Nonobstructive right nephrolithiasis. 4. Complex left renal lesion is most likely a hemorrhagic/proteinaceous cyst; this could be further characterized with nonemergent pre- and postcontrast CT or MRI using a renal protocol. 5. Incidental findings, including: Tiny hiatal hernia. Aortic atherosclerosis (icd10-i70.0). Electronically signed by: Rockey Kilts MD 01/09/2024 03:25 PM EST RP  Workstation: [id number]     Procedures   Medications Ordered in the ED  iohexol  (OMNIPAQUE ) 350 MG/ML injection 75 mL (75 mLs Intravenous Contrast Given 01/09/24 1435)                                    Medical Decision Making Amount and/or Complexity of Data Reviewed Labs: ordered. Radiology: ordered.  Risk Prescription drug management.   This patient is a 68 y.o. female who presents to the ED for concern of abdominal pain, this involves an extensive number of treatment options, and is a complaint that carries with it a high risk of complications and morbidity. The emergent differential diagnosis prior to evaluation includes, but is not limited to,  PUD, gastritis, pancreatitis, gastroparesis, malignancy, biliary disease, ACS, pericarditis, pneumonia, intestinal ischemia, esophageal rupture, hepatitis, pregnancy  This is not an exhaustive differential.   Past Medical History / Co-morbidities / Social History:  has a past medical history of ADPKD (autosomal dominant polycystic kidney), Allergic rhinitis, Arthritis of hand, Asthma, Cervical radiculopathy due to osteoarthritis of spine, GAD (generalized anxiety disorder), History of adenomatous polyp of colon, Hypertension, Liver cyst, Moderate persistent reactive airway disease with acute exacerbation, Osteopenia (2024), Polycystic kidney, Polycystic kidney disease, Pruritus ani, PVC (premature ventricular contraction), Renal stone, and Spinal stenosis.  Additional history: Chart reviewed.  Physical Exam: Physical exam performed. The pertinent findings include: RUQ TTP without rebound or guarding, negative Murphy sign  Lab Tests: I ordered, and personally interpreted labs.  The pertinent results include:  no acute laboratory abnormalities   Imaging Studies: I ordered imaging studies including CT abdomen pelvis, RUQ US . I independently visualized and interpreted imaging which showed   CT:  1. Polycystic liver/kidney disease. No  acute superimposed process. 2. Colonic stool burden suggests constipation. No other explanation for pain. 3. Nonobstructive right nephrolithiasis. 4. Complex left renal lesion is most likely a hemorrhagic/proteinaceous cyst; this could be further characterized with nonemergent pre- and postcontrast CT or MRI using a renal protocol. 5. Incidental findings, including: Tiny hiatal hernia. Aortic atherosclerosis  US :  1. No acute abnormality. 2. Polycystic liver / kidney disease.  I agree with the radiologist interpretation.   Cardiac Monitoring:  The patient was maintained on a cardiac monitor.  My attending physician viewed and interpreted the cardiac monitored which showed an underlying rhythm of: sinus rhythm, no STEMI. I agree with this interpretation.   Medications: Offered pain medication which patient declined   Disposition: After consideration of the diagnostic results and the patients response to treatment, I feel that emergency department workup does not suggest an emergent condition requiring admission or immediate intervention beyond what has been performed at this time. The plan is: discharge with close outpatient follow-up and return precautions. Patients work-up is benign. She still has some discomfort but is refusing all pain medication. Symptoms are not pleuritic in nature, has no chest pain, EKG without ischemic changes. Low suspicion for ACS/PE.  Discussed incidental findings on CT with recommendations. Also discussed constipation seen on imaging, could be etiology of symptoms. Recommend Miralax cleanout and close follow-up. Evaluation and diagnostic testing in the emergency department does not suggest an emergent condition requiring admission or immediate intervention beyond what has been performed at this time.  Plan for discharge with close PCP follow-up.  Patient is understanding and amenable with plan, educated on red flag symptoms that would prompt immediate return.  Patient  discharged in stable condition.   I discussed this case with my attending physician Dr. Lenor who cosigned this note including patient's presenting symptoms, physical exam, and planned diagnostics and interventions. Attending physician stated agreement with plan or made changes to plan which were implemented.    Final diagnoses:  Right upper quadrant abdominal pain  Constipation, unspecified constipation type    ED Discharge Orders     None     An After Visit Summary was printed and given to the patient.      Nora Lauraine LABOR, PA-C 01/09/24 1853    Lenor Hollering, MD 01/09/24 2022

## 2024-01-09 NOTE — ED Triage Notes (Signed)
 Pt states she has 8/10 abd pain with concerns for her gall bladder.

## 2024-01-09 NOTE — Discharge Instructions (Signed)
 As we discussed, continue to observe fall reassuring for acute findings.  Laboratory evaluation, CT, and ultrasound imaging did not reveal any emergent cause of your pain. Your CT did show that it looks like you are constipated which could be contributing to your symptoms. For this, I recommend that you do a Miralax bowel cleanout for relief which includes 8 capfulls of Miralax in Gatorade. Drink this in 1 hour on a day where you dont have plans and expect significant bowel movements throughout the day ending in liquid stools. You may continue forward with 1 capfull of Miralax twice a day if difficulties with bowel movements continue.   Please call your PCP to schedule close follow-up appointment.  Return if development of any new or worsening symptoms.

## 2024-01-14 DIAGNOSIS — L821 Other seborrheic keratosis: Secondary | ICD-10-CM | POA: Diagnosis not present

## 2024-01-14 DIAGNOSIS — D485 Neoplasm of uncertain behavior of skin: Secondary | ICD-10-CM | POA: Diagnosis not present

## 2024-01-14 DIAGNOSIS — D225 Melanocytic nevi of trunk: Secondary | ICD-10-CM | POA: Diagnosis not present

## 2024-01-14 DIAGNOSIS — L570 Actinic keratosis: Secondary | ICD-10-CM | POA: Diagnosis not present

## 2024-01-14 DIAGNOSIS — C44729 Squamous cell carcinoma of skin of left lower limb, including hip: Secondary | ICD-10-CM | POA: Diagnosis not present

## 2024-01-14 DIAGNOSIS — D2239 Melanocytic nevi of other parts of face: Secondary | ICD-10-CM | POA: Diagnosis not present

## 2024-01-19 DIAGNOSIS — R1011 Right upper quadrant pain: Secondary | ICD-10-CM | POA: Diagnosis not present

## 2024-01-21 ENCOUNTER — Emergency Department (HOSPITAL_COMMUNITY)
Admission: EM | Admit: 2024-01-21 | Discharge: 2024-01-21 | Disposition: A | Discharge status: Home / Self Care | Attending: Emergency Medicine | Admitting: Emergency Medicine

## 2024-01-21 ENCOUNTER — Emergency Department (HOSPITAL_COMMUNITY)

## 2024-01-21 ENCOUNTER — Encounter (HOSPITAL_COMMUNITY): Payer: Self-pay

## 2024-01-21 DIAGNOSIS — K7689 Other specified diseases of liver: Secondary | ICD-10-CM | POA: Diagnosis not present

## 2024-01-21 DIAGNOSIS — R1011 Right upper quadrant pain: Secondary | ICD-10-CM | POA: Insufficient documentation

## 2024-01-21 DIAGNOSIS — N281 Cyst of kidney, acquired: Secondary | ICD-10-CM

## 2024-01-21 DIAGNOSIS — R0781 Pleurodynia: Secondary | ICD-10-CM | POA: Diagnosis not present

## 2024-01-21 DIAGNOSIS — R1013 Epigastric pain: Secondary | ICD-10-CM | POA: Diagnosis not present

## 2024-01-21 DIAGNOSIS — R0602 Shortness of breath: Secondary | ICD-10-CM | POA: Diagnosis not present

## 2024-01-21 LAB — COMPREHENSIVE METABOLIC PANEL WITH GFR
ALT: 20 U/L (ref 0–44)
AST: 23 U/L (ref 15–41)
Albumin: 3.8 g/dL (ref 3.5–5.0)
Alkaline Phosphatase: 64 U/L (ref 38–126)
Anion gap: 12 (ref 5–15)
BUN: 20 mg/dL (ref 8–23)
CO2: 25 mmol/L (ref 22–32)
Calcium: 9.3 mg/dL (ref 8.9–10.3)
Chloride: 101 mmol/L (ref 98–111)
Creatinine, Ser: 0.76 mg/dL (ref 0.44–1.00)
GFR, Estimated: >60 mL/min (ref >60)
Glucose, Bld: 99 mg/dL (ref 70–99)
Potassium: 4.4 mmol/L (ref 3.5–5.1)
Sodium: 138 mmol/L (ref 135–145)
Total Bilirubin: 0.4 mg/dL (ref 0.0–1.2)
Total Protein: 6.6 g/dL (ref 6.5–8.1)

## 2024-01-21 LAB — CBC
HCT: 43.1 % (ref 36.0–46.0)
Hemoglobin: 13.3 g/dL (ref 12.0–15.0)
MCH: 27 pg (ref 26.0–34.0)
MCHC: 30.9 g/dL (ref 30.0–36.0)
MCV: 87.6 fL (ref 80.0–100.0)
Platelets: 213 K/uL (ref 150–400)
RBC: 4.92 MIL/uL (ref 3.87–5.11)
RDW: 14 % (ref 11.5–15.5)
WBC: 5.9 K/uL (ref 4.0–10.5)
nRBC: 0 % (ref 0.0–0.2)

## 2024-01-21 LAB — URINALYSIS, ROUTINE W REFLEX MICROSCOPIC
Bilirubin Urine: NEGATIVE
Glucose, UA: NEGATIVE mg/dL
Hgb urine dipstick: NEGATIVE
Ketones, ur: NEGATIVE mg/dL
Leukocytes,Ua: NEGATIVE
Nitrite: NEGATIVE
Protein, ur: NEGATIVE mg/dL
Specific Gravity, Urine: 1.015 (ref 1.005–1.030)
pH: 7 (ref 5.0–8.0)

## 2024-01-21 LAB — TROPONIN I (HIGH SENSITIVITY)
Troponin I (High Sensitivity): 3 ng/L (ref <18)
Troponin I (High Sensitivity): 4 ng/L (ref <18)

## 2024-01-21 LAB — LIPASE, BLOOD: Lipase: 32 U/L (ref 11–51)

## 2024-01-21 MED ORDER — LACTATED RINGERS IV BOLUS
1000.0000 mL | Freq: Once | INTRAVENOUS | Status: AC
  Administered 2024-01-21: 1000 mL via INTRAVENOUS

## 2024-01-21 MED ORDER — FAMOTIDINE IN NACL 20-0.9 MG/50ML-% IV SOLN
20.0000 mg | Freq: Once | INTRAVENOUS | Status: AC
  Administered 2024-01-21: 20 mg via INTRAVENOUS
  Filled 2024-01-21: qty 50

## 2024-01-21 MED ORDER — ONDANSETRON HCL 4 MG/2ML IJ SOLN
4.0000 mg | Freq: Once | INTRAMUSCULAR | Status: AC
  Administered 2024-01-21: 4 mg via INTRAVENOUS
  Filled 2024-01-21: qty 2

## 2024-01-21 MED ORDER — HYDROMORPHONE HCL 1 MG/ML IJ SOLN
1.0000 mg | Freq: Once | INTRAMUSCULAR | Status: AC
  Administered 2024-01-21: 1 mg via INTRAVENOUS
  Filled 2024-01-21: qty 1

## 2024-01-21 MED ORDER — METHOCARBAMOL 500 MG PO TABS: 500.0000 mg | ORAL_TABLET | Freq: Two times a day (BID) | ORAL | 0 refills | Status: DC

## 2024-01-21 MED ORDER — IOHEXOL 350 MG/ML SOLN
75.0000 mL | Freq: Once | INTRAVENOUS | Status: AC | PRN
  Administered 2024-01-21: 75 mL via INTRAVENOUS

## 2024-01-21 NOTE — ED Triage Notes (Signed)
 Pt c/o RUQ abdominal pain x1 day.  Pain score 10/10.  Pt has not taken anything for pain.  Denies n/v/d.  Pt was seen on 11/9 for the same w/o clear diagnosis.

## 2024-01-21 NOTE — ED Provider Notes (Signed)
 [name] [name] Provider Note   CSN: [id number] Arrival date & time: 01/21/24  9143     Patient presents with: Abdominal Pain   [name] [name] is a 68 y.o. female who presents ED today secondary to right upper quadrant abdominal pain over the last day.  Was seen on 9 November for the same, has history of polycystic renal and hepatic disease, large renal masses seen on the lower pole of the right kidney, and multiple cysts noted on the liver on previous imaging.  Results of workup was diagnosis of constipation as cause of possible epigastric pain and recommendation of using MiraLAX which the patient has been using, noted normal bowel movements since then.  Her primary concern is that originally pain was located in the epigastrium however now pain is spread into the right flank.  Further review of the previous medical history shows previous diagnosis of hypertension, asthma, cervical spondylosis without myelopathy, GAD, polycystic renal and hepatic disease.    Abdominal Pain      Prior to Admission medications   Medication Sig Start Date End Date Taking? Authorizing Provider  amLODipine (NORVASC) 5 MG tablet Take 5 mg by mouth daily.  11/17/10   [provider]  calcium  carbonate (OS-CAL) 600 MG TABS Take 600 mg by mouth daily.    [provider]  Cholecalciferol (D3 HIGH POTENCY) 50 MCG (2000 UT) CAPS Take by mouth.    [provider]  diclofenac Sodium (VOLTAREN) 1 % GEL Apply topically as needed.    [provider]  montelukast (SINGULAIR) 10 MG tablet Take 10 mg by mouth daily. 11/19/17   [provider]  Multiple Vitamin (MULTIVITAMIN) capsule Take 1 capsule by mouth daily.    [provider]  PROAIR  HFA 108 (90 BASE) MCG/ACT inhaler as needed. 02/06/11   [provider]  rosuvastatin  (CRESTOR ) 10 MG tablet Take 1 tablet (10 mg total) by mouth daily. 02/10/23   Nishan, Peter C,  MD  SYMBICORT 160-4.5 MCG/ACT inhaler Inhale 2 puffs into the lungs daily.  02/05/11   [provider]  triamcinolone  (NASACORT) 55 MCG/ACT AERO nasal inhaler 2 sprays daily as needed. nasal 07/21/06   [provider]  VITAMIN D PO Take 2,000 Units by mouth.    [provider]    Allergies: Ace inhibitors, Cefuroxime axetil, Irbesartan, Prinivil [lisinopril], Sulfonamide derivatives, and Fexofenadine    Review of Systems  Gastrointestinal:  Positive for abdominal pain.  All other systems reviewed and are negative.   Updated Vital Signs BP (!) 159/70 (BP Location: Right Arm)   Pulse 67   Temp 98.3 F (36.8 C)   Resp 14   Ht 5' 1 (1.549 m)   Wt 72.6 kg   LMP 03/03/2007 (Approximate)   SpO2 100%   BMI 30.23 kg/m   Physical Exam Vitals and nursing note reviewed.  Constitutional:      General: She is not in acute distress.    Appearance: Normal appearance. She is well-developed and normal weight.  HENT:     Head: Normocephalic and atraumatic.     Mouth/Throat:     Mouth: Mucous membranes are moist.     Pharynx: Oropharynx is clear.  Eyes:     General: No visual field deficit or scleral icterus.    Extraocular Movements: Extraocular movements intact.     Conjunctiva/sclera: Conjunctivae normal.     Pupils: Pupils are equal, round, and reactive to light.  Cardiovascular:  Rate and Rhythm: Normal rate and regular rhythm.     Pulses: Normal pulses.     Heart sounds: Normal heart sounds. No murmur heard.    No friction rub. No gallop.  Pulmonary:     Effort: Pulmonary effort is normal.     Breath sounds: Normal breath sounds.  Abdominal:     General: Abdomen is flat. Bowel sounds are normal.     Palpations: Abdomen is soft.     Tenderness: There is abdominal tenderness in the right upper quadrant and epigastric area.     Hernia: No hernia is present.  Musculoskeletal:        General: Normal range of motion.     Cervical back: Normal range of  motion and neck supple.     Right lower leg: No edema.     Left lower leg: No edema.  Skin:    General: Skin is warm and dry.     Capillary Refill: Capillary refill takes less than 2 seconds.  Neurological:     General: No focal deficit present.     Mental Status: She is alert and oriented to person, place, and time. Mental status is at baseline.     GCS: GCS eye subscore is 4. GCS verbal subscore is 5. GCS motor subscore is 6.     Cranial Nerves: No cranial nerve deficit, dysarthria or facial asymmetry.     Sensory: Sensation is intact.     Motor: Motor function is intact.     Coordination: Coordination is intact.  Psychiatric:        Mood and Affect: Mood normal.     (all labs ordered are listed, but only abnormal results are displayed) Labs Reviewed  LIPASE, BLOOD  COMPREHENSIVE METABOLIC PANEL WITH GFR  CBC  URINALYSIS, ROUTINE W REFLEX MICROSCOPIC  TROPONIN I (HIGH SENSITIVITY)  TROPONIN I (HIGH SENSITIVITY)    EKG: None  Radiology: CT ABDOMEN PELVIS W CONTRAST Result Date: 01/21/2024 EXAM: CT ABDOMEN AND PELVIS WITH CONTRAST 01/21/2024 12:01:17 PM TECHNIQUE: CT of the abdomen and pelvis was performed with the administration of 75 mL of iohexol  (OMNIPAQUE ) 350 MG/ML injection. Multiplanar reformatted images are provided for review. Automated exposure control, iterative reconstruction, and/or weight-based adjustment of the mA/kV was utilized to reduce the radiation dose to as low as reasonably achievable. COMPARISON: 12 days ago. CLINICAL HISTORY: Abdominal pain, acute, nonlocalized; Worsening right upper quad abdominal pain, pleuritic chest pain, and shortness of breath. Breathing difficulties are new and pain is now 10 out of 10. FINDINGS: LOWER CHEST: No acute abnormality. LIVER: Findings consistent with polycystic liver disease stable 12 days ago. GALLBLADDER AND BILE DUCTS: Gallbladder is unremarkable. No biliary ductal dilatation. SPLEEN: No acute abnormality. PANCREAS:  No acute abnormality. ADRENAL GLANDS: No acute abnormality. KIDNEYS, URETERS AND BLADDER: Findings consistent with polycystic kidney disease stable 12 days ago. 11 mm hyperdense exophytic abnormality seen involving upper pole of left kidney which may represent hemorrhagic cyst. For further evaluation with outpatient CT or MRI using renal protocol may be performed. No stones in the kidneys or ureters. No hydronephrosis. No perinephric or periureteral stranding. Urinary bladder is unremarkable. GI AND BOWEL: Stomach demonstrates no acute abnormality. There is no bowel obstruction. PERITONEUM AND RETROPERITONEUM: No ascites. No free air. VASCULATURE: Aorta is normal in caliber. LYMPH NODES: No lymphadenopathy. REPRODUCTIVE ORGANS: No acute abnormality. BONES AND SOFT TISSUES: No acute osseous abnormality. No focal soft tissue abnormality. IMPRESSION: 1. Stable findings consistent with polycystic kidney and liver disease. 2.  11 mm hyperdense exophytic lesion in the upper pole of the left kidney, indeterminate; recommend renal protocol MRI (preferred) or CT without and with contrast for characterization. Electronically signed by: Lynwood Seip MD 01/21/2024 12:53 PM EST RP Workstation: [id number]   CT Angio Chest PE W and/or Wo Contrast Result Date: 01/21/2024 EXAM: CTA of the Chest with and without contrast for PE 01/21/2024 12:01:17 PM TECHNIQUE: CTA of the chest was performed without and with the administration of 75 mL of iohexol  (OMNIPAQUE ) 350 MG/ML injection. Multiplanar reformatted images are provided for review. MIP images are provided for review. Automated exposure control, iterative reconstruction, and/or weight based adjustment of the mA/kV was utilized to reduce the radiation dose to as low as reasonably achievable. COMPARISON: 08/18/2021 CLINICAL HISTORY: Pulmonary embolism (PE) suspected, high prob; Worsening right upper quad abdominal pain, pleuritic chest pain, and shortness of breath. Breathing  difficulties are new and pain is now 10 out of 10. FINDINGS: PULMONARY ARTERIES: Pulmonary arteries are adequately opacified for evaluation. No pulmonary embolism. Main pulmonary artery is normal in caliber. MEDIASTINUM: The heart and pericardium demonstrate no acute abnormality. There is no acute abnormality of the thoracic aorta. LYMPH NODES: No mediastinal, hilar or axillary lymphadenopathy. LUNGS AND PLEURA: The lungs are without acute process. No focal consolidation or pulmonary edema. No pleural effusion or pneumothorax. UPPER ABDOMEN: Findings consistent with polycystic liver and kidney disease. SOFT TISSUES AND BONES: No acute bone or soft tissue abnormality. IMPRESSION: 1. No pulmonary embolism. 2. Polycystic liver and kidney disease. Electronically signed by: Lynwood Seip MD 01/21/2024 12:45 PM EST RP Workstation: [id number]   US  Abdomen Limited RUQ (LIVER/GB) Result Date: 01/21/2024 CLINICAL DATA:  Right upper quadrant abdominal pain EXAM: ULTRASOUND ABDOMEN LIMITED RIGHT UPPER QUADRANT COMPARISON:  CT abdomen and pelvis dated 01/09/2024 FINDINGS: Gallbladder: No gallstones or wall thickening visualized. No sonographic Murphy sign noted by sonographer. Common bile duct: Diameter: 5 mm Liver: Heterogeneous liver parenchyma with multifocal cysts. 2.7 x 2.6 x 2.4 cm echogenic focus within the right hepatic lobe, likely corresponding to previously characterized venous malformation. Portal vein is patent on color Doppler imaging with normal direction of blood flow towards the liver. Other: Patient reported area of pain corresponds to dominant right renal cyst measuring 10.0 x 9.5 x 8.3 cm. IMPRESSION: 1. Patient reported area of pain corresponds to dominant right renal cyst measuring 10.0 x 9.5 x 8.3 cm. 2. Heterogeneous liver parenchyma with multifocal cysts. 3. No sonographic finding of acute cholecystitis. Electronically Signed   By: Limin  Xu M.D.   On: 01/21/2024 11:37     Procedures    Medications Ordered in the ED  iohexol  (OMNIPAQUE ) 350 MG/ML injection 75 mL (75 mLs Intravenous Contrast Given 01/21/24 1201)    Clinical Course as of 01/21/24 1509  Fri Jan 21, 2024  1501 S, epigatric, R flank pain, sim pain 1 week ago, hx polycystic kidney and liver disease, massive cyst inferior pole R kidney, unchanged from prior, pain medication given, if imp then amenable for d/c.  [BS]    Clinical Course User Index [BS] Arlee Katz, MD                                 Medical Decision Making Amount and/or Complexity of Data Reviewed Labs: ordered.  Risk Prescription drug management.   Medical Decision Making:   Taleyah Hillman is a 68 y.o. female who presented to the ED today with epigastric  and right flank pain detailed above.    Additional history discussed with patient's family/caregivers.  External chart has been reviewed including previous labs and imaging. Patient's presentation is complicated by their history of polycystic renal and hepatic disease.  Complete initial physical exam performed, notably the patient  was alert and oriented in no apparent distress.  Tenderness to the epigastrium and right upper quadrant is noted..    Reviewed and confirmed nursing documentation for past medical history, family history, social history.    Initial Assessment:   With the patient's presentation of epigastric/right flank pain, given history of polycystic kidney disease as well as polycystic hepatic disease, consider sequelae of the same, possible ruptured cyst.  Further consider pancreatic etiology of pain, potential ACS, possible PE.   Initial Plan:  Per triage orders, obtained CT of the abdomen and pelvis to evaluate for intra-abdominal pathology, possible ruptured cyst. CT angiography of the lungs was obtained to evaluate for possible pulmonary embolus. Limited ultrasound of the right upper quadrant was obtained to evaluate for hepatobiliary disorder. Screening  labs including CBC and Metabolic panel to evaluate for infectious or metabolic etiology of disease.  Serum lipase to evaluate for possible pancreatic etiology Urinalysis with reflex culture ordered to evaluate for UTI or relevant urologic/nephrologic pathology.  EKG and serial troponin to evaluate for cardiac pathology. Objective evaluation as below reviewed   Initial Study Results:   Laboratory  All laboratory results reviewed without evidence of clinically relevant pathology.   Exceptions include: None  EKG EKG was reviewed independently. Rate, rhythm, axis, intervals all examined and without medically relevant abnormality. ST segments without concerns for elevations.    Radiology:  All images reviewed independently. Agree with radiology report at this time.   CT ABDOMEN PELVIS W CONTRAST Result Date: 01/21/2024 EXAM: CT ABDOMEN AND PELVIS WITH CONTRAST 01/21/2024 12:01:17 PM TECHNIQUE: CT of the abdomen and pelvis was performed with the administration of 75 mL of iohexol  (OMNIPAQUE ) 350 MG/ML injection. Multiplanar reformatted images are provided for review. Automated exposure control, iterative reconstruction, and/or weight-based adjustment of the mA/kV was utilized to reduce the radiation dose to as low as reasonably achievable. COMPARISON: 12 days ago. CLINICAL HISTORY: Abdominal pain, acute, nonlocalized; Worsening right upper quad abdominal pain, pleuritic chest pain, and shortness of breath. Breathing difficulties are new and pain is now 10 out of 10. FINDINGS: LOWER CHEST: No acute abnormality. LIVER: Findings consistent with polycystic liver disease stable 12 days ago. GALLBLADDER AND BILE DUCTS: Gallbladder is unremarkable. No biliary ductal dilatation. SPLEEN: No acute abnormality. PANCREAS: No acute abnormality. ADRENAL GLANDS: No acute abnormality. KIDNEYS, URETERS AND BLADDER: Findings consistent with polycystic kidney disease stable 12 days ago. 11 mm hyperdense exophytic  abnormality seen involving upper pole of left kidney which may represent hemorrhagic cyst. For further evaluation with outpatient CT or MRI using renal protocol may be performed. No stones in the kidneys or ureters. No hydronephrosis. No perinephric or periureteral stranding. Urinary bladder is unremarkable. GI AND BOWEL: Stomach demonstrates no acute abnormality. There is no bowel obstruction. PERITONEUM AND RETROPERITONEUM: No ascites. No free air. VASCULATURE: Aorta is normal in caliber. LYMPH NODES: No lymphadenopathy. REPRODUCTIVE ORGANS: No acute abnormality. BONES AND SOFT TISSUES: No acute osseous abnormality. No focal soft tissue abnormality. IMPRESSION: 1. Stable findings consistent with polycystic kidney and liver disease. 2. 11 mm hyperdense exophytic lesion in the upper pole of the left kidney, indeterminate; recommend renal protocol MRI (preferred) or CT without and with contrast for characterization. Electronically signed by:  Lynwood Seip MD 01/21/2024 12:53 PM EST RP Workstation: [id number]   CT Angio Chest PE W and/or Wo Contrast Result Date: 01/21/2024 EXAM: CTA of the Chest with and without contrast for PE 01/21/2024 12:01:17 PM TECHNIQUE: CTA of the chest was performed without and with the administration of 75 mL of iohexol  (OMNIPAQUE ) 350 MG/ML injection. Multiplanar reformatted images are provided for review. MIP images are provided for review. Automated exposure control, iterative reconstruction, and/or weight based adjustment of the mA/kV was utilized to reduce the radiation dose to as low as reasonably achievable. COMPARISON: 08/18/2021 CLINICAL HISTORY: Pulmonary embolism (PE) suspected, high prob; Worsening right upper quad abdominal pain, pleuritic chest pain, and shortness of breath. Breathing difficulties are new and pain is now 10 out of 10. FINDINGS: PULMONARY ARTERIES: Pulmonary arteries are adequately opacified for evaluation. No pulmonary embolism. Main pulmonary artery is normal  in caliber. MEDIASTINUM: The heart and pericardium demonstrate no acute abnormality. There is no acute abnormality of the thoracic aorta. LYMPH NODES: No mediastinal, hilar or axillary lymphadenopathy. LUNGS AND PLEURA: The lungs are without acute process. No focal consolidation or pulmonary edema. No pleural effusion or pneumothorax. UPPER ABDOMEN: Findings consistent with polycystic liver and kidney disease. SOFT TISSUES AND BONES: No acute bone or soft tissue abnormality. IMPRESSION: 1. No pulmonary embolism. 2. Polycystic liver and kidney disease. Electronically signed by: Lynwood Seip MD 01/21/2024 12:45 PM EST RP Workstation: [id number]   US  Abdomen Limited RUQ (LIVER/GB) Result Date: 01/21/2024 CLINICAL DATA:  Right upper quadrant abdominal pain EXAM: ULTRASOUND ABDOMEN LIMITED RIGHT UPPER QUADRANT COMPARISON:  CT abdomen and pelvis dated 01/09/2024 FINDINGS: Gallbladder: No gallstones or wall thickening visualized. No sonographic Murphy sign noted by sonographer. Common bile duct: Diameter: 5 mm Liver: Heterogeneous liver parenchyma with multifocal cysts. 2.7 x 2.6 x 2.4 cm echogenic focus within the right hepatic lobe, likely corresponding to previously characterized venous malformation. Portal vein is patent on color Doppler imaging with normal direction of blood flow towards the liver. Other: Patient reported area of pain corresponds to dominant right renal cyst measuring 10.0 x 9.5 x 8.3 cm. IMPRESSION: 1. Patient reported area of pain corresponds to dominant right renal cyst measuring 10.0 x 9.5 x 8.3 cm. 2. Heterogeneous liver parenchyma with multifocal cysts. 3. No sonographic finding of acute cholecystitis. Electronically Signed   By: Limin  Xu M.D.   On: 01/21/2024 11:37   US  Abdomen Limited RUQ (LIVER/GB) Result Date: 01/09/2024 EXAM: Right Upper Quadrant Abdominal Ultrasound 01/09/2024 05:54:01 PM TECHNIQUE: Real-time ultrasonography of the right upper quadrant of the abdomen was performed.  COMPARISON: Comparison with study dated 01/09/2024. CLINICAL HISTORY: RUQ abdominal pain. FINDINGS: LIVER: The liver is multicystic. In the right hepatic lobe, there is a 2.4 x 3.0 x 2.1 cm described echogenic focus compatible with hemangioma as seen on same day CT abdomen and pelvis. Patent portal vein with antegrade flow. No intrahepatic biliary ductal dilatation. BILIARY SYSTEM: The extrahepatic biliary tree is not well visualized. The common bile duct measures approximately 3 mm. No pericholecystic fluid or wall thickening. No cholelithiasis. Negative sonographic Murphy's sign. OTHER: Polycystic right kidney. IMPRESSION: 1. No acute abnormality. 2. Polycystic liver / kidney disease. Electronically signed by: Norman Gatlin MD 01/09/2024 06:17 PM EST RP Workstation: [id number]   CT ABDOMEN PELVIS W CONTRAST Result Date: 01/09/2024 EXAM: CT ABDOMEN AND PELVIS WITH CONTRAST 01/09/2024 02:50:12 PM TECHNIQUE: CT of the abdomen and pelvis was performed with the administration of 75 mL of iohexol  (OMNIPAQUE ) 350 MG/ML injection. Multiplanar reformatted images  are provided for review. Automated exposure control, iterative reconstruction, and/or weight-based adjustment of the mA/kV was utilized to reduce the radiation dose to as low as reasonably achievable. COMPARISON: Renal ultrasound 07/22/2022. Most recent CT of 09/21/2013. CLINICAL HISTORY: Abdominal pain, acute, nonlocalized. FINDINGS: LOWER CHEST: Normal heart size. Clear lung bases. LIVER: Innumerable hepatic cysts, greater left than right. A peripherally enhancing high right hepatic lobe 2.7 cm lesion on image 14 / 2 is consistent with a hemangioma. Dominant left hepatic lobe hemangioma on the prior exam is no longer identified. No intrahepatic biliary duct dilatation. GALLBLADDER AND BILE DUCTS: The gallbladder is likely identified on image 32 / 2, without acute cholecystitis or biliary duct dilatation. SPLEEN: Normal in size and morphology. PANCREAS:  Normal, without duct dilatation or acute inflammation. ADRENAL GLANDS: No acute abnormality. KIDNEYS, URETERS AND BLADDER: Innumerable cysts throughout both kidneys. Dominant lower pole right renal cyst measures 9.3 cm. At least 1 left renal lesion demonstrates complexity including at 8 mm on image 16 / 2. Punctate lower pole right renal collecting system calculi. No hydronephrosis. Normal urinary bladder. GI AND BOWEL: Normal stomach, without wall thickening. Normal small bowel caliber. Colonic stool burden suggests constipation. Normal appendix including image 59 / 2. Tiny hiatal hernia. There is no bowel obstruction. PERITONEUM AND RETROPERITONEUM: No ascites. No free air. VASCULATURE: Aorta is normal in caliber. Aortic atherosclerosis. Prominent gonadal veins as can be seen with pelvic congestion syndrome. LYMPH NODES: No lymphadenopathy. REPRODUCTIVE ORGANS: Normal uterus, without adnexal mass. Mild pelvic floor laxity. BONES AND SOFT TISSUES: Mild left convex lumbar spine curvature with lumbar spondylosis. Incidental image breast implants. No focal soft tissue abnormality. IMPRESSION: 1. Polycystic liver/kidney disease. No acute superimposed process. 2. Colonic stool burden suggests constipation. No other explanation for pain. 3. Nonobstructive right nephrolithiasis. 4. Complex left renal lesion is most likely a hemorrhagic/proteinaceous cyst; this could be further characterized with nonemergent pre- and postcontrast CT or MRI using a renal protocol. 5. Incidental findings, including: Tiny hiatal hernia. Aortic atherosclerosis ([id number]). Electronically signed by: Rockey Kilts MD 01/09/2024 03:25 PM EST RP Workstation: [id number]    Reassessment and Plan:   Patient given a 1 mg dose of Dilaudid , plan at this time is to reassess.  CT imaging did not show any acute findings, does show previously noted renal cyst, unchanged from previous imaging on 9 November.  No other acute findings were found on the CT  scan.  CTA for the rule out of pulmonary embolus was negative.  Ultrasound of the right upper quadrant was negative for acute cholecystitis, lab evaluation does not show any elevation of transaminases or alkaline phosphatase, does not show any elevation in lipase.  Troponins are within normal limits and showed no change over 2-hour period.  CBC is unremarkable without any findings of leukocytosis.  Urine was also unremarkable.  Given this patient's reassuring findings on the evaluation, we will plan for reassessment of the patient after administration of IV pain medication.  Plan at this time is if improved with IV pain medication, transition to p.o. pain medication and refer for outpatient imaging, follow-up with primary care, likely biopsy of the cyst, and follow-up with urology and nephrology.  Care signed out to B.Skinner,DO.       Final diagnoses:  None    ED Discharge Orders     None          Myriam Dorn BROCKS, [place] 01/21/24 1516    Rogelia Jerilynn RAMAN, MD 01/24/24 1357

## 2024-01-21 NOTE — Discharge Instructions (Signed)
 I would recommend calling your primary care doctor or nephrologist to receive a referral to interventional radiology.  You can discuss with them undergoing IR drainage of renal cyst considering its size, and possible nerve compression.  I provided a prescription for Robaxin , would recommend continuing to take Tylenol to help with the pain.

## 2024-01-21 NOTE — ED Provider Notes (Signed)
  Physical Exam  BP (!) 159/70 (BP Location: Right Arm)   Pulse 67   Temp 98.3 F (36.8 C)   Resp 14   Ht 5' 1 (1.549 m)   Wt 72.6 kg   LMP 03/03/2007 (Approximate)   SpO2 100%   BMI 30.23 kg/m   Physical Exam Vitals and nursing note reviewed.  Constitutional:      General: She is not in acute distress.    Appearance: She is well-developed.  HENT:     Head: Normocephalic and atraumatic.  Eyes:     Conjunctiva/sclera: Conjunctivae normal.  Cardiovascular:     Rate and Rhythm: Normal rate and regular rhythm.     Heart sounds: No murmur heard. Pulmonary:     Effort: Pulmonary effort is normal. No respiratory distress.     Breath sounds: Normal breath sounds.  Abdominal:     Palpations: Abdomen is soft.     Tenderness: There is no abdominal tenderness.  Musculoskeletal:        General: No swelling.     Cervical back: Neck supple.  Skin:    General: Skin is warm and dry.     Capillary Refill: Capillary refill takes less than 2 seconds.  Neurological:     Mental Status: She is alert.  Psychiatric:        Mood and Affect: Mood normal.     Procedures  Procedures  ED Course / MDM   Clinical Course as of 01/22/24 0033  Fri Jan 21, 2024  1501 S, epigatric, R flank pain, sim pain 1 week ago, hx polycystic kidney and liver disease, massive cyst inferior pole R kidney, unchanged from prior, pain medication given, if imp then amenable for d/c.  [BS]    Clinical Course User Index [BS] Arlee Katz, MD   Medical Decision Making Amount and/or Complexity of Data Reviewed Labs: ordered.  Risk Prescription drug management.   With repeat evaluation, patient pain has significantly improved.  Patient overall feels significantly well, and is requesting discharge at this time.  Discussed recommendation to follow-up with PCP, as well as nephrology, to receive a consult to interventional radiology to discuss possible surgical drainage of large renal cyst, which is likely  causing patient's symptoms at this time.  Provided prescription of Robaxin  for home with musculoskeletal component to her abdominal pain.  Overall, patient is stable for discharge at this time.       Arlee Katz, MD 01/22/24 9964    Patt Alm Macho, MD 01/22/24 [phone]

## 2024-01-21 NOTE — ED Provider Triage Note (Signed)
 Emergency Medicine Provider Triage Evaluation Note  [name] [name] , a 68 y.o. female  was evaluated in triage.  Pt complains of worsening right upper quadrant abdominal pain but now with worsening pleuritic chest pain and shortness of breath.  Review of Systems  Positive: Pleuritic right lower chest pain, shortness of breath, right upper abdominal pain, urinary frequency Negative: Rash, trauma, fevers, chills, cough, nausea, vomiting, constipation, diarrhea  Physical Exam  BP (!) 183/85 (BP Location: Right Arm)   Pulse 70   Temp 97.9 F (36.6 C) (Oral)   Resp 20   Ht 5' 1 (1.549 m)   Wt 72.6 kg   LMP 03/03/2007 (Approximate)   SpO2 100%   BMI 30.23 kg/m  Gen:   Awake, appears uncomfortable with right chest pain and shortness of breath and right upper abdominal pain Resp:  Normal effort MSK:   Moves extremities without difficulty Other:  Tenderness in central and right upper abdomen, tenderness in right lower chest, pleuritic symptoms.  Lungs clear.  Medical Decision Making  Medically screening exam initiated at 9:34 AM.  Appropriate orders placed.  [name] [name] was informed that the remainder of the evaluation will be completed by another provider, this initial triage assessment does not replace that evaluation, and the importance of remaining in the ED until their evaluation is complete.  [name] [name] is a 68 y.o. female with past medical history significant for asthma, hypertension, polycystic kidney disease, liver cyst, and osteopenia who presents for worsening right upper abdominal pain and now right chest pain and shortness of breath.  According to patient, for the last 2 weeks or so she has been having pain in her right upper abdomen that had a reassuring workup 2 weeks ago with ultrasound and CT scan.  She reportedly followed up with her PCP several days ago.  Is still unclear was causing her symptoms but now she says the last few days it is worsening and is now 10  out of 10.  It is from her epigastric area to her right upper quadrant around towards the right flank and then now she is having pleuritic chest pain and shortness of breath and cannot take a deep breath.  This is different.  This is worse than prior.  She reports no history of blood clots in her legs or lungs and denies any constipation, diarrhea, nausea, or vomiting.  She has been taking MiraLAX as recommended but she has no bowel changes.  She does report some urinary frequency.  Denies any leg pain or leg swelling.  Denies trauma.  Denies rash to suggest shingles.  Reports the pain keeps worsening with the breathing difficulty so she presents for evaluation.  On exam, lungs were clear.  Right lower chest was slightly tender to palpation.  Right upper quadrant and epigastric area is tender to palpation.  No rashes or shingles.  Bowel sounds normal.  Flank not focally tender.  No CVA tenderness.  No leg tenderness or leg swelling.  Intact pulses in extremities.  Patient appears uncomfortable with the pain.  Although patient had imaging over 2 weeks ago, her symptoms are worsening and now she is having difficulty taking a deep breath.  She does not appear to have had workup to rule out a thrombolic etiology or PE causing the right upper abdomen and right lower chest pain so we will get a CT PE study.  With her pain acutely worsening and her exam seeming somewhat consistent with cholecystitis, we will  get a repeat image and ultrasound of the abdomen.  We will get screening labs.  She says she does not want pain medicine at this time as she does not want to mask what is going on.  Anticipate reassessment and disposition by provider after labs and imaging when she gets back to room.     Denny Mccree, Lonni PARAS, MD 01/21/24 [phone]

## 2024-01-21 NOTE — ED Notes (Signed)
 ED Provider at bedside.

## 2024-01-24 DIAGNOSIS — M545 Low back pain, unspecified: Secondary | ICD-10-CM | POA: Diagnosis not present

## 2024-01-24 DIAGNOSIS — M6281 Muscle weakness (generalized): Secondary | ICD-10-CM | POA: Diagnosis not present

## 2024-01-25 DIAGNOSIS — R1011 Right upper quadrant pain: Secondary | ICD-10-CM | POA: Diagnosis not present

## 2024-01-25 DIAGNOSIS — I129 Hypertensive chronic kidney disease with stage 1 through stage 4 chronic kidney disease, or unspecified chronic kidney disease: Secondary | ICD-10-CM | POA: Diagnosis not present

## 2024-01-25 DIAGNOSIS — N182 Chronic kidney disease, stage 2 (mild): Secondary | ICD-10-CM | POA: Diagnosis not present

## 2024-01-25 DIAGNOSIS — Q613 Polycystic kidney, unspecified: Secondary | ICD-10-CM | POA: Diagnosis not present

## 2024-01-26 ENCOUNTER — Other Ambulatory Visit: Payer: Self-pay | Admitting: Internal Medicine

## 2024-01-26 DIAGNOSIS — R1011 Right upper quadrant pain: Secondary | ICD-10-CM

## 2024-01-28 ENCOUNTER — Encounter (HOSPITAL_COMMUNITY)
Admission: RE | Admit: 2024-01-28 | Discharge: 2024-01-28 | Disposition: A | Discharge status: Home / Self Care | Source: Ambulatory Visit | Attending: Internal Medicine | Admitting: Internal Medicine

## 2024-01-28 ENCOUNTER — Encounter (HOSPITAL_COMMUNITY): Payer: Self-pay

## 2024-01-28 DIAGNOSIS — R109 Unspecified abdominal pain: Secondary | ICD-10-CM | POA: Diagnosis not present

## 2024-01-28 DIAGNOSIS — R1011 Right upper quadrant pain: Secondary | ICD-10-CM

## 2024-01-28 MED ORDER — TECHNETIUM TC 99M MEBROFENIN IV KIT
5.0000 | PACK | Freq: Once | INTRAVENOUS | Status: AC
  Administered 2024-01-28: 5.45 via INTRAVENOUS

## 2024-01-31 ENCOUNTER — Other Ambulatory Visit (HOSPITAL_COMMUNITY): Payer: Self-pay | Admitting: Nephrology

## 2024-01-31 DIAGNOSIS — R1011 Right upper quadrant pain: Secondary | ICD-10-CM

## 2024-02-01 DIAGNOSIS — M6281 Muscle weakness (generalized): Secondary | ICD-10-CM | POA: Diagnosis not present

## 2024-02-01 DIAGNOSIS — M545 Low back pain, unspecified: Secondary | ICD-10-CM | POA: Diagnosis not present

## 2024-02-03 DIAGNOSIS — M545 Low back pain, unspecified: Secondary | ICD-10-CM | POA: Diagnosis not present

## 2024-02-03 DIAGNOSIS — M6281 Muscle weakness (generalized): Secondary | ICD-10-CM | POA: Diagnosis not present

## 2024-02-07 ENCOUNTER — Inpatient Hospital Stay: Admission: RE | Admit: 2024-02-07 | Discharge: 2024-02-07 | Attending: Internal Medicine | Admitting: Internal Medicine

## 2024-02-07 DIAGNOSIS — Z1231 Encounter for screening mammogram for malignant neoplasm of breast: Secondary | ICD-10-CM

## 2024-02-09 DIAGNOSIS — R1011 Right upper quadrant pain: Secondary | ICD-10-CM | POA: Diagnosis not present

## 2024-02-13 ENCOUNTER — Ambulatory Visit: Payer: Self-pay | Admitting: Obstetrics and Gynecology

## 2024-02-15 ENCOUNTER — Other Ambulatory Visit: Payer: Self-pay | Admitting: Urology

## 2024-02-15 DIAGNOSIS — N281 Cyst of kidney, acquired: Secondary | ICD-10-CM

## 2024-02-29 ENCOUNTER — Ambulatory Visit: Admitting: Cardiovascular Disease
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
## 2024-03-03 ENCOUNTER — Ambulatory Visit
Admission: RE | Admit: 2024-03-03 | Discharge: 2024-03-03 | Disposition: A | Discharge status: Home / Self Care | Source: Ambulatory Visit | Attending: Urology | Admitting: Urology

## 2024-03-03 DIAGNOSIS — N281 Cyst of kidney, acquired: Secondary | ICD-10-CM

## 2024-03-03 MED ORDER — GADOPICLENOL 0.5 MMOL/ML IV SOLN
7.5000 mL | Freq: Once | INTRAVENOUS | Status: AC | PRN
  Administered 2024-03-03: 5 mL via INTRAVENOUS

## 2024-03-08 ENCOUNTER — Encounter: Payer: Self-pay | Admitting: Radiology

## 2024-03-08 ENCOUNTER — Other Ambulatory Visit: Payer: Self-pay | Admitting: Urology

## 2024-03-08 DIAGNOSIS — N281 Cyst of kidney, acquired: Secondary | ICD-10-CM

## 2024-03-08 NOTE — Progress Notes (Signed)
 [name] [name] POUR, MD  Daralene Ferol FALCON, RT Approved for US  guided aspiration of largest cyst emanating from the right kidney. Let's schedule in IR lab at [facility], please.  Moderate sedation.  HKM       Previous Messages    ----- Message ----- From: Daralene Ferol FALCON, RT Sent: 03/08/2024   1:33 PM EST To: Ir Procedure Requests Subject: CT Guided Aspiration                          Procedure : CT Guided Aspiration  Reason : polycystic kidney, adult type  History :MR ABDOMEN WWO CONTRAST (Accession [id number]) (Order [id number]), CT ABDOMEN PELVIS W CONTRAST (Accession [id number]) (Order [id number]), US  Abdomen Limited RUQ (LIVER/GB) (Accession [id number]) (Order [id number]), US  Abdomen Limited RUQ (LIVER/GB) (Accession [id number]) (Order [id number]), CT ABDOMEN PELVIS W CONTRAST (Accession [id number]) (Order [id number])  Provider: Lonni Han  Provider contact ; [phone]

## 2024-03-09 ENCOUNTER — Ambulatory Visit: Attending: Cardiovascular Disease | Admitting: Cardiovascular Disease

## 2024-03-09 VITALS — BP 144/92 | HR 69 | Ht 61.0 in | Wt 166.0 lb

## 2024-03-09 DIAGNOSIS — I493 Ventricular premature depolarization: Secondary | ICD-10-CM | POA: Diagnosis not present

## 2024-03-09 DIAGNOSIS — I340 Nonrheumatic mitral (valve) insufficiency: Secondary | ICD-10-CM

## 2024-03-09 DIAGNOSIS — E782 Mixed hyperlipidemia: Secondary | ICD-10-CM

## 2024-03-09 MED ORDER — ROSUVASTATIN CALCIUM 10 MG PO TABS: 10.0000 mg | ORAL_TABLET | Freq: Every day | ORAL | 3 refills | Status: AC

## 2024-03-09 NOTE — Patient Instructions (Signed)
 Medication Instructions:  No Changes *If you need a refill on your cardiac medications before your next appointment, please call your pharmacy*  Lab Work: None  Follow-Up: At [facility], you and your health needs are our priority.  As part of our continuing mission to provide you with exceptional heart care, our providers are all part of one team.  This team includes your primary Cardiologist (physician) and Advanced Practice Providers or APPs (Physician Assistants and Nurse Practitioners) who all work together to provide you with the care you need, when you need it.  Your next appointment:   1 year(s)  Provider:   Maude Emmer, MD

## 2024-03-26 ENCOUNTER — Other Ambulatory Visit: Payer: Self-pay | Admitting: Radiology

## 2024-03-26 DIAGNOSIS — N281 Cyst of kidney, acquired: Secondary | ICD-10-CM

## 2024-03-27 ENCOUNTER — Ambulatory Visit: Admitting: Radiology

## 2024-03-30 ENCOUNTER — Other Ambulatory Visit: Payer: Self-pay | Admitting: Radiology

## 2024-03-30 ENCOUNTER — Telehealth: Payer: Self-pay

## 2024-03-30 NOTE — Telephone Encounter (Signed)
 Patient for IR aspiration of largest renal cyst on Friday 03/31/24, I called and spoke with the patient on the phone and gave pre-procedure instructions. Pt was made aware to be here at 1p, NPO after MN prior to procedure as well as driver post procedure/recovery/discharge. Pt stated understanding. Called 03/30/24

## 2024-03-31 ENCOUNTER — Ambulatory Visit
Admission: RE | Admit: 2024-03-31 | Discharge: 2024-03-31 | Disposition: A | Source: Ambulatory Visit | Attending: Urology | Admitting: Radiology

## 2024-03-31 ENCOUNTER — Encounter: Payer: Self-pay | Admitting: Radiology

## 2024-03-31 DIAGNOSIS — N281 Cyst of kidney, acquired: Secondary | ICD-10-CM | POA: Insufficient documentation

## 2024-03-31 DIAGNOSIS — R109 Unspecified abdominal pain: Secondary | ICD-10-CM | POA: Diagnosis not present

## 2024-03-31 DIAGNOSIS — M25551 Pain in right hip: Secondary | ICD-10-CM | POA: Insufficient documentation

## 2024-03-31 LAB — CBC
HCT: 42.3 % (ref 36.0–46.0)
Hemoglobin: 13.6 g/dL (ref 12.0–15.0)
MCH: 27.5 pg (ref 26.0–34.0)
MCHC: 32.2 g/dL (ref 30.0–36.0)
MCV: 85.6 fL (ref 80.0–100.0)
Platelets: 243 10*3/uL (ref 150–400)
RBC: 4.94 MIL/uL (ref 3.87–5.11)
RDW: 14.4 % (ref 11.5–15.5)
WBC: 6.8 10*3/uL (ref 4.0–10.5)
nRBC: 0 % (ref 0.0–0.2)

## 2024-03-31 MED ORDER — SODIUM CHLORIDE 0.9 % IV SOLN: INTRAVENOUS | Status: DC

## 2024-03-31 MED ORDER — MIDAZOLAM HCL 5 MG/5ML IJ SOLN
INTRAMUSCULAR | Status: DC | PRN
  Administered 2024-03-31 (×2): 1 mg via INTRAVENOUS

## 2024-03-31 MED ORDER — FENTANYL CITRATE (PF) 100 MCG/2ML IJ SOLN
INTRAMUSCULAR | Status: DC | PRN
  Administered 2024-03-31 (×2): 50 ug via INTRAVENOUS

## 2024-03-31 MED ORDER — FENTANYL CITRATE (PF) 100 MCG/2ML IJ SOLN
INTRAMUSCULAR | Status: AC
  Filled 2024-03-31: qty 2

## 2024-03-31 MED ORDER — MIDAZOLAM HCL 2 MG/2ML IJ SOLN
INTRAMUSCULAR | Status: AC
  Filled 2024-03-31: qty 2

## 2024-03-31 NOTE — H&P (Signed)
 "   Chief Complaint:  Large Right Renal Cyst  Procedure: Percutaneous Aspiration of renal cyst   Referring Provider(s): Dr. JAYSON LABOR Devere  Supervising Physician: Karalee Beat  Patient Status: [facility] - Out-pt  History of Present Illness: [name] [name] is a 69 y.o. female with a history of polycystic kidney disease who has been experiencing worsening right sided abdominal pain and hip pain for the past several months. Patient reports she presented to the ED in November of last year due to right sided pain. She initially thought her symptoms were related to her gallbladder; however, workup at that time was unremarkable for cholecystitis. US  Abdomen did reveal a dominant right renal cyst measuring 10.0 x 9.5 x 8.3cm. It was recommended she follow up with nephrologist outpatient as well as consider IR for possible percutaneous drainage. She underwent additional workup with Dr. Valentine in Surgery in December who again recommended follow up with Urology and Nephrology. Given patient's persistent discomfort correlating with her enlarged right renal cyst, she was referred to IR for possible percutaneous aspiration.   She presents today with family at the bedside. She reports continued right sided abdominal pain, particularly with rotational movements as well as intermittent right hip pain. Also admits to increased urination. She denies any recent fevers/chills, nausea/vomiting, chest pain, shortness of breath, dysuria, or hematuria. NPO since midnight. All questions and concerns answered at the bedside.   Patient is Full Code  Past Medical History:  Diagnosis Date   ADPKD (autosomal dominant polycystic kidney)    Allergic rhinitis    Arthritis of hand    Asthma    Cervical radiculopathy due to osteoarthritis of spine    GAD (generalized anxiety disorder)    History of adenomatous polyp of colon    Hypertension    Liver cyst    Moderate persistent reactive airway disease with acute  exacerbation    Osteopenia 2024   hip   Polycystic kidney    Polycystic kidney disease    followed at [facility], stage II PKD   Pruritus ani    PVC (premature ventricular contraction)    Renal stone    Spinal stenosis     Past Surgical History:  Procedure Laterality Date   AUGMENTATION MAMMAPLASTY Bilateral    BREAST ENHANCEMENT SURGERY  1999   DILATION AND CURETTAGE OF UTERUS  1992   missed AB   KNEE CARTILAGE SURGERY Right    ROTATOR CUFF REPAIR Right    TUBAL LIGATION Bilateral 1993    Allergies: Ace inhibitors, Cefuroxime axetil, Irbesartan, Prinivil [lisinopril], Sulfonamide derivatives, and Fexofenadine  Medications: Prior to Admission medications  Medication Sig Start Date End Date Taking? Authorizing Provider  amLODipine (NORVASC) 5 MG tablet Take 5 mg by mouth daily.  11/17/10  Yes [provider]  calcium  carbonate (OS-CAL) 600 MG TABS Take 600 mg by mouth daily.   Yes [provider]  Cholecalciferol (D3 HIGH POTENCY) 50 MCG (2000 UT) CAPS Take by mouth.   Yes [provider]  montelukast (SINGULAIR) 10 MG tablet Take 10 mg by mouth daily. 11/19/17  Yes [provider]  Multiple Vitamin (MULTIVITAMIN) capsule Take 1 capsule by mouth daily.   Yes [provider]  rosuvastatin  (CRESTOR ) 10 MG tablet Take 1 tablet (10 mg total) by mouth daily. 03/09/24  Yes Nishan, Peter C, MD  SYMBICORT 160-4.5 MCG/ACT inhaler Inhale 2 puffs into the lungs daily.  02/05/11  Yes [provider]  VITAMIN D PO Take 2,000 Units by  mouth.   Yes [provider]  acetaminophen (TYLENOL) 650 MG CR tablet as needed for pain.    [provider]  albuterol  (PROVENTIL ) (2.5 MG/3ML) 0.083% nebulizer solution as needed for wheezing or shortness of breath. 02/16/24   [provider]  diclofenac Sodium (VOLTAREN) 1 % GEL Apply topically as needed.    [provider]  fluticasone (FLONASE) 50 MCG/ACT nasal spray  1 spray daily. 12/22/21   [provider]  PROAIR  HFA 108 (90 BASE) MCG/ACT inhaler as needed. 02/06/11   [provider]  triamcinolone  (NASACORT) 55 MCG/ACT AERO nasal inhaler 2 sprays daily as needed. nasal 07/21/06   [provider]     Family History  Problem Relation Age of Onset   Hypertension Mother    Hyperlipidemia Mother    Stroke Mother    Hypertension Father    Cancer Maternal Aunt        breast   Breast cancer Maternal Aunt    Breast cancer Daughter        stage 3    Social History   Socioeconomic History   Marital status: Married    Spouse name: Not on file   Number of children: 2   Years of education: Not on file   Highest education level: Not on file  Occupational History   Occupation: DELIVERY SERVICE COORDINATOR    Employer: [employer]  Tobacco Use   Smoking status: Never   Smokeless tobacco: Never  Vaping Use   Vaping status: Never Used  Substance and Sexual Activity   Alcohol use: No   Drug use: No   Sexual activity: Yes    Partners: Male    Birth control/protection: Post-menopausal    Comment: less than 5, IC after 18, no STD, no abnormal pap, no DES exposure  Other Topics Concern   Not on file  Social History Narrative   Right handed   Social Drivers of Health   Tobacco Use: Low Risk (03/31/2024)   Patient History    Smoking Tobacco Use: Never    Smokeless Tobacco Use: Never    Passive Exposure: Not on file  Financial Resource Strain: Not on file  Food Insecurity: Not on file  Transportation Needs: Not on file  Physical Activity: Not on file  Stress: Not on file  Social Connections: Not on file  Depression (EYV7-0): Not on file  Alcohol Screen: Not on file  Housing: Unknown (02/09/2024)   Received from [facility]   Epic    Unable to Pay for Housing in the Last Year: Not on file    Number of Times Moved in the Last Year: Not on file    At any time in the past 12 months, were  you homeless or living in a shelter (including now)?: No  Utilities: Not on file  Health Literacy: Not on file     Review of Systems  Gastrointestinal:  Positive for abdominal pain (right sided).  Genitourinary:  Positive for frequency.  Patient denies any headache, chest pain, shortness of breath, N/V, or fever/chills. All other systems are negative.   Vital Signs: BP (!) 170/98   Pulse 64   Temp 98.2 F (36.8 C) (Temporal)   Resp 10   Ht 5' 1 (1.549 m)   Wt 164 lb (74.4 kg)   LMP 03/03/2007   SpO2 99%   BMI 30.99 kg/m    Physical Exam Vitals reviewed.  Constitutional:      Appearance: Normal  appearance.  HENT:     Mouth/Throat:     Mouth: Mucous membranes are moist.     Pharynx: Oropharynx is clear.  Cardiovascular:     Rate and Rhythm: Normal rate and regular rhythm.     Heart sounds: Normal heart sounds.  Pulmonary:     Effort: Pulmonary effort is normal.     Breath sounds: Normal breath sounds.  Abdominal:     General: Abdomen is flat.     Palpations: Abdomen is soft.     Tenderness: There is abdominal tenderness (minimal right sided).  Skin:    General: Skin is warm and dry.  Neurological:     Mental Status: She is alert and oriented to person, place, and time.  Psychiatric:        Behavior: Behavior normal.     Imaging: MR ABDOMEN WWO CONTRAST Result Date: 03/03/2024 CLINICAL DATA:  Sharp abdominal radiating pain. Follow-up on cyst seen on kidney, left-side. EXAM: MRI ABDOMEN WITHOUT AND WITH CONTRAST TECHNIQUE: Multiplanar multisequence MR imaging of the abdomen was performed both before and after the administration of intravenous contrast. CONTRAST:  5 mL of Vueway . COMPARISON:  CT scan abdomen and pelvis from 01/21/2024. FINDINGS: Lower chest: Unremarkable MR appearance to the lung bases. No pleural effusion. No pericardial effusion. Normal heart size. Hepatobiliary: The liver is normal in size. Noncirrhotic configuration. There are innumerable cysts  throughout liver occupying approximately 40-50% of the total liver parenchyma. No suspicious liver lesions seen. No intrahepatic or extrahepatic bile duct dilatation. No choledocholithiasis. Unremarkable gallbladder. Pancreas: No mass, inflammatory changes or other parenchymal abnormality identified. No main pancreatic duct dilation. Spleen:  Within normal limits in size and appearance. No focal mass. Adrenals/Urinary Tract: Unremarkable adrenal glands. Bilateral kidneys exhibit innumerable cysts of varying sizes with largest arising from the right kidney lower pole measuring up to 7.1 x 9.7 cm. The cysts occupying more than 50% of the total liver parenchyma, bilaterally. No obstructive uropathy. Left kidney measures 5.4 x 6.5 x 11.7 cm, volume-213.5 cc. Right kidney measures 4.6 x 8.8 x 12 cm, volume-252.6 cc. There also multiple T1 hyperintense nonenhancing structures throughout bilateral kidneys with largest arising from the left kidney upper pole anteriorly measuring up to 1.4 x 1.9 cm, favored to represent proteinaceous/hemorrhagic cysts. The 1.1 cm hyperdense lesion described on the prior CT scan abdomen and pelvis corresponds to proteinaceous/hemorrhagic cyst on this exam (series 9, image 25). Stomach/Bowel: Visualized portions within the abdomen are unremarkable. No disproportionate dilation of bowel loops. Vascular/Lymphatic: No pathologically enlarged lymph nodes identified. No abdominal aortic aneurysm demonstrated. No ascites. Other:  None. Musculoskeletal: No suspicious bone lesions identified. IMPRESSION: 1. There are innumerable cysts throughout the liver and bilateral kidneys, compatible with autosomal dominant polycystic kidney disease. There are also multiple proteinaceous/hemorrhagic cysts in bilateral kidneys. No suspicious renal mass seen. 2. The 1.1 cm hyperdense lesion described on the prior CT scan abdomen and pelvis corresponds to proteinaceous / hemorrhagic cyst on this exam. 3. Multiple  other nonacute observations, as described above. Electronically Signed   By: Ree Molt M.D.   On: 03/03/2024 13:39    Labs:  CBC: Recent Labs    01/09/24 1204 01/21/24 0914 03/31/24 1306  WBC 7.5 5.9 6.8  HGB 13.7 13.3 13.6  HCT 42.6 43.1 42.3  PLT 234 213 243    COAGS: No results for input(s): INR, APTT in the last 8760 hours.  BMP: Recent Labs    01/09/24 1204 01/21/24 0914  NA 139 138  K 4.1 4.4  CL 103 101  CO2 27 25  GLUCOSE 89 99  BUN 14 20  CALCIUM  10.1 9.3  CREATININE 0.77 0.76  GFRNONAA >60 >60    LIVER FUNCTION TESTS: Recent Labs    01/09/24 1204 01/21/24 0914  BILITOT 0.6 0.4  AST 24 23  ALT 22 20  ALKPHOS 63 64  PROT 6.7 6.6  ALBUMIN 3.9 3.8    TUMOR MARKERS: No results for input(s): AFPTM, CEA, CA199, CHROMGRNA in the last 8760 hours.  Assessment and Plan:  Large Right Renal Cyst: [name] [name] is a 69 y.o. female with a history of polycystic kidney disease with a large right renal cyst that has been causing progressively worsening abdominal pain for the past year. She presents to [facility] Interventional Radiology department for an image-guided percutaneous aspiration of right renal cyst with Dr. Karalee. Procedure to be performed under moderate sedation.  Risks and benefits of aspiration were discussed with the patient including bleeding, infection, damage to adjacent structures, and sepsis.  All of the patient's questions were answered, patient is agreeable to proceed. Consent signed and in chart.   Thank you for allowing our service to participate in Shantell Belongia 's care.    Electronically Signed: Karsin Pesta M Tayton Decaire, PA-C   03/31/2024, 2:00 PM     I spent a total of  40 Minutes in face to face in clinical consultation, greater than 50% of which was counseling/coordinating care for renal cyst aspiration. "

## 2024-03-31 NOTE — Procedures (Signed)
 Interventional Radiology Procedure Note  Procedure: US  guided aspiration of giant right renal cyst.  500 mL evacuated.   Complications: None  Estimated Blood Loss: None  Recommendations: - DC home   Signed,  Wilkie LOIS Lent, MD

## 2025-02-06 ENCOUNTER — Encounter: Admitting: Obstetrics and Gynecology
